# Patient Record
Sex: Male | Born: 1935
Health system: Southern US, Community
[De-identification: ages and names within clinical notes are randomized; demographics above are authoritative.]

## PROBLEM LIST (undated history)

## (undated) DIAGNOSIS — E538 Deficiency of other specified B group vitamins: Secondary | ICD-10-CM

## (undated) DIAGNOSIS — E785 Hyperlipidemia, unspecified: Secondary | ICD-10-CM

## (undated) DIAGNOSIS — R634 Abnormal weight loss: Secondary | ICD-10-CM

## (undated) DIAGNOSIS — M810 Age-related osteoporosis without current pathological fracture: Secondary | ICD-10-CM

## (undated) DIAGNOSIS — I10 Essential (primary) hypertension: Secondary | ICD-10-CM

## (undated) DIAGNOSIS — R0609 Other forms of dyspnea: Secondary | ICD-10-CM

## (undated) DIAGNOSIS — I480 Paroxysmal atrial fibrillation: Secondary | ICD-10-CM

## (undated) HISTORY — DX: Essential (primary) hypertension: I10

## (undated) HISTORY — DX: Abnormal weight loss: R63.4

## (undated) HISTORY — DX: Hyperlipidemia, unspecified: E78.5

## (undated) HISTORY — DX: Age-related osteoporosis without current pathological fracture: M81.0

## (undated) HISTORY — DX: Other forms of dyspnea: R06.09

## (undated) HISTORY — DX: Paroxysmal atrial fibrillation: I48.0

## (undated) HISTORY — PX: COLONOSCOPY: SHX174

## (undated) HISTORY — PX: NO PAST SURGERIES: SHX2092

## (undated) HISTORY — DX: Deficiency of other specified B group vitamins: E53.8

---

## 2009-08-24 ENCOUNTER — Ambulatory Visit: Payer: Self-pay | Admitting: Cardiovascular Disease

## 2009-09-12 ENCOUNTER — Ambulatory Visit: Payer: Self-pay | Admitting: Cardiovascular Disease

## 2009-10-19 ENCOUNTER — Ambulatory Visit: Payer: Self-pay | Admitting: Cardiovascular Disease

## 2010-02-03 ENCOUNTER — Ambulatory Visit: Payer: Self-pay | Admitting: Cardiovascular Disease

## 2010-05-30 ENCOUNTER — Ambulatory Visit: Payer: Self-pay | Admitting: Cardiovascular Disease

## 2010-09-12 NOTE — Procedures (Signed)
Summary: SUMMARY REPORT  SUMMARY REPORT   Imported By: Mirna Mires 09/14/2009 11:56:09  _____________________________________________________________________  External Attachment:    Type:   Image     Comment:   External Document

## 2010-09-12 NOTE — Procedures (Signed)
Summary: summary report  summary report   Imported By: Mirna Mires 09/14/2009 15:05:00  _____________________________________________________________________  External Attachment:    Type:   Image     Comment:   External Document

## 2010-11-30 ENCOUNTER — Encounter: Payer: Self-pay | Admitting: Cardiovascular Disease

## 2010-12-04 ENCOUNTER — Ambulatory Visit (INDEPENDENT_AMBULATORY_CARE_PROVIDER_SITE_OTHER): Payer: Medicare Other | Admitting: Cardiovascular Disease

## 2010-12-04 ENCOUNTER — Encounter: Payer: Self-pay | Admitting: Cardiovascular Disease

## 2010-12-04 DIAGNOSIS — I480 Paroxysmal atrial fibrillation: Secondary | ICD-10-CM | POA: Insufficient documentation

## 2010-12-04 DIAGNOSIS — I119 Hypertensive heart disease without heart failure: Secondary | ICD-10-CM | POA: Insufficient documentation

## 2010-12-04 DIAGNOSIS — E785 Hyperlipidemia, unspecified: Secondary | ICD-10-CM | POA: Insufficient documentation

## 2010-12-04 DIAGNOSIS — I4891 Unspecified atrial fibrillation: Secondary | ICD-10-CM

## 2010-12-04 DIAGNOSIS — I10 Essential (primary) hypertension: Secondary | ICD-10-CM

## 2010-12-04 NOTE — Assessment & Plan Note (Addendum)
He is normal sinus rhythm. His A-fib burden seems to be very low.  Continue long term anticoagulation with Pradaxa.

## 2010-12-04 NOTE — Assessment & Plan Note (Signed)
BP is reasonably controlled.  

## 2010-12-04 NOTE — Assessment & Plan Note (Signed)
Continue treatment with Simvastatin and fish oil. Goal LDL <100

## 2010-12-04 NOTE — Patient Instructions (Signed)
Your physician recommends that you schedule a follow-up appointment in: 6 months  

## 2010-12-04 NOTE — Progress Notes (Signed)
HPI  Jorge Lawrence is an 77 y/o male who is here for a routine 6 months follow up visit. He is doing very well. He denies any chest pain, dyspnea or palpitations. He is usually aware when his heart is irregular but that overall is very rare. He is tolerating Pradexa without any reported side effects.   Allergies  Allergen Reactions  . Aspirin      Current Outpatient Prescriptions on File Prior to Visit  Medication Sig Dispense Refill  . dabigatran (PRADAXA) 150 MG CAPS Take 150 mg by mouth every 12 (twelve) hours.        . fish oil-omega-3 fatty acids 1000 MG capsule Take 2 g by mouth daily.        Marland Kitchen losartan-hydrochlorothiazide (HYZAAR) 100-12.5 MG per tablet Take 1 tablet by mouth daily.        . simvastatin (ZOCOR) 20 MG tablet Take 20 mg by mouth at bedtime.           Past Medical History  Diagnosis Date  . HTN (hypertension)   . HLD (hyperlipidemia)   . Paroxysmal atrial fibrillation      No past surgical history on file.   No family history on file.   History   Social History  . Marital Status: Married    Spouse Name: N/A    Number of Children: 1  . Years of Education: N/A   Occupational History  . retired    Social History Main Topics  . Smoking status: Never Smoker   . Smokeless tobacco: Not on file  . Alcohol Use: No  . Drug Use: No  . Sexually Active: Not on file   Other Topics Concern  . Not on file   Social History Narrative  . No narrative on file     ROS Constitutional: Negative for fever, chills, diaphoresis, activity change, appetite change and fatigue.  HENT: Negative for hearing loss, nosebleeds, congestion, sore throat, facial swelling, drooling, trouble swallowing, neck pain, voice change, sinus pressure and tinnitus.  Eyes: Negative for photophobia, pain, discharge and visual disturbance.  Respiratory: Negative for apnea, cough, chest tightness, shortness of breath and wheezing.  Cardiovascular: Negative for chest pain, palpitations and  leg swelling.  Gastrointestinal: Negative for nausea, vomiting, abdominal pain, diarrhea, constipation, blood in stool and abdominal distention.  Genitourinary: Negative for dysuria, urgency, frequency, hematuria and decreased urine volume.  Musculoskeletal: Negative for myalgias, back pain, joint swelling, arthralgias and gait problem.  Skin: Negative for color change, pallor, rash and wound.  Neurological: Negative for dizziness, tremors, seizures, syncope, speech difficulty, weakness, light-headedness, numbness and headaches.  Psychiatric/Behavioral: Negative for suicidal ideas, hallucinations, behavioral problems and agitation. The patient is not nervous/anxious.     PHYSICAL EXAM   BP 142/80  Pulse 62  Ht 5\' 8"  (1.727 m)  Wt 180 lb (81.647 kg)  BMI 27.37 kg/m2  SpO2 94% Constitutional: He is oriented to person, place, and time. He appears well-developed and well-nourished. No distress.  HENT: No nasal discharge.  Head: Normocephalic and atraumatic.  Eyes: Pupils are equal, round, and reactive to light. Right eye exhibits no discharge. Left eye exhibits no discharge.  Neck: Normal range of motion. Neck supple. No JVD present. No thyromegaly present.  Cardiovascular: Normal rate, regular rhythm, normal heart sounds and intact distal pulses. Exam reveals no gallop and no friction rub.  No murmur heard.  Pulmonary/Chest: Effort normal and breath sounds normal. No stridor. No respiratory distress. He has no wheezes. He has no  rales. He exhibits no tenderness.  Abdominal: Soft. Bowel sounds are normal. He exhibits no distension. There is no tenderness. There is no rebound and no guarding.  Musculoskeletal: Normal range of motion. He exhibits no edema and no tenderness.  Neurological: He is alert and oriented to person, place, and time. Coordination normal.  Skin: Skin is warm and dry. No rash noted. He is not diaphoretic. No erythema. No pallor.  Psychiatric: He has a normal mood and  affect. His behavior is normal. Judgment and thought content normal.     ASSESSMENT AND PLAN

## 2010-12-26 NOTE — Assessment & Plan Note (Signed)
[facility]                        [place] CARDIOLOGY OFFICE NOTE   Jorge Lawrence, Jorge Lawrence                          MRN:          [id number]  DATE:08/24/2009                            DOB:          1927/05/05    CHIEF COMPLAINT:  Arrhythmia.   HISTORY OF PRESENT ILLNESS:  Jorge Lawrence is an 77 year old white male  with past medical history significant for hypertension who is presenting  for evaluation of possible atrial flutter.  The patient states that he  has a relatively healthy life only with a diagnosis of hypertension and  hyperlipidemia both of which are well controlled.  He remains relatively  active in that he walks 45 minutes several times a week without any  difficulty, whatsoever.  Specifically, he denies any chest discomfort,  palpitations, shortness of breath, dizziness, or syncope.  He was seen  by his primary care physician, Dr. Lorin Picket who felt that an EKG at that  time was consistent with atrial flutter with a controlled ventricular  response.  At that time, he was initiated on Pradaxa therapy.  Since  then, the patient states he continues to feel completely normal and at  baseline with no complaints.   PAST MEDICAL HISTORY:  As above in HPI.   SOCIAL HISTORY:  No tobacco, no alcohol.   FAMILY HISTORY:  Negative for premature coronary artery disease.   ALLERGIES:  The patient states that ASPIRIN caused a rash between his  fingers.   MEDICATIONS:  1. Simvastatin 20 mg p.o. nightly.  2. Benicar/HCT 20/12.5 daily.  3. Pradaxa150 mg b.i.d.  4. Fish oil.   REVIEW OF SYSTEMS:  As in the HPI.  All other systems are reviewed and  are negative.   PHYSICAL EXAMINATION:  VITAL SIGNS:  Blood pressure is 134/77, pulse is  85, saturating 97% on room air, and he weighs 176 pounds.  GENERAL:  No acute distress.  HEENT:  Normocephalic and atraumatic.  NECK:  Supple.  There is no JVD.  There are no carotid bruits.  HEART:  Regular rate and  rhythm without murmur, rub, or gallop.  LUNGS:  Clear bilaterally.  ABDOMEN:  Soft, nontender, and nondistended.  EXTREMITIES:  Without edema.  SKIN:  Warm and dry.  PSYCH:  The patient is appropriate with normal levels of insight.  NEURO:  Nonfocal.  MUSCULOSKELETAL:  The patient has 5/5 bilateral upper and lower  extremity strength.  VASCULAR:  2+ bilateral carotid, radial, and posterior tibial pulses.   LABORATORY DATA:  Reviewed by me from August 04, 2009, include a  sodium 141, potassium 4.2, chloride 101, BUN 15, and creatinine 1.1.  LFTs are also within normal limits except for mildly elevated protein at  8.3, his albumin is 4.5.  CBC:  White count 6.7, hemoglobin 16.3,  hematocrit 49, and platelet count 253.  PSA 3.3.  EKG taken today in  clinic independently reviewed by myself demonstrates normal sinus rhythm  with premature atrial contractions.  Review of EKGs from Dr. Roby Lofts  office dated August 04, 2009.  There is likely an atrial arrhythmia  that is probably  atrial flutter.  The QRS is regular and narrow at a  rate of approximately 60 beats per minute.  The flutter waves appear  close to typical, however.  There is some irregularity in their contour  raising some suspicion for artifact.   ASSESSMENT:  An 77 year old white male presenting with an incidental  discovery of what is possibly atrial flutter.  The patient is completely  asymptomatic from any arrhythmias at this time.  He is also not having  any symptoms consistent with angina or heart failure.   PLAN:  We would like to check a transthoracic echocardiogram to evaluate  the patient's heart structure and function.  I will also place the  patient on a 48-hour Holter monitor to assess for the presence of any  abnormal arrhythmias.  He can continue on the Pradaxa therapy for now.     Brayton El, MD  Electronically Signed    SGA/MedQ  DD: 08/24/2009  DT: 08/25/2009  Job #: [id number]

## 2010-12-26 NOTE — Assessment & Plan Note (Signed)
[facility]                        [place] CARDIOLOGY OFFICE NOTE   RHYDIAN, BALDI                          MRN:          [id number]  DATE:02/03/2010                            DOB:          1926-11-17    PROBLEM LIST:  1. Hypertension.  2. Hyperlipidemia.  3. Probable paroxysmal atrial flutter.   INTERVAL HISTORY:  The patient seems to do well.  He walks almost on a  daily basis without any significant difficulty.  He denies any chest  discomfort or shortness of breath.  He also has not had any issues with  bleeding while on the Pradaxa.   PHYSICAL EXAMINATION:  VITAL SIGNS:  On physical exam today, his blood  pressure is 122/65, pulse is 64, he is saturating 95% on room air, and  he weighs 178 pounds which is stable from March of this year.  GENERAL:  No acute distress.  HEENT:  Normocephalic, atraumatic.  NECK:  Supple.  No JVD.  No carotid bruits.  HEART:  Regular rate and rhythm without murmur, rub, or gallop.  LUNGS:  Clear bilaterally.  ABDOMEN:  Soft, nontender, nondistended.  EXTREMITIES:  Without edema.  SKIN:  Warm and dry.   EKG taken today in clinic independently interpreted by myself indicates  normal sinus rhythm with premature atrial contractions.   ASSESSMENT AND PLAN:  The patient is doing well.  He is about to change  his blood pressure medication to Benicar HCTZ to lisinopril HCTZ for  cost reasons.  He is being followed by Dr. Lorin Picket for hypertension and  hyperlipidemia.  He should continue on the Pradaxa 150 b.i.d. and be  observant for any potential bleeding.     Brayton El, MD  Electronically Signed    SGA/MedQ  DD: 02/03/2010  DT: 02/04/2010  Job #: [id number]

## 2010-12-26 NOTE — Assessment & Plan Note (Signed)
[facility]                         CARDIOLOGY OFFICE NOTE   Jorge Lawrence, Jorge Lawrence                          MRN:          [id number]  DATE:05/30/2010                            DOB:          1927-04-23    Jorge Lawrence is an 77 year old gentleman who is here today for a followup  visit.  He has the following problem list:  1. Probable paroxysmal atrial fibrillation.  2. Hypertension.  3. Hyperlipidemia.   INTERVAL HISTORY:  The patient has been doing very well.  There has been  no chest pain, dyspnea, dizziness, or syncope.  He does get episodes of  fluttering sensation in his chest every now and then.  He denies any  episodes of tachycardia.  He has been taking Pradaxa without any  reported side effects.   MEDICATIONS:  1. Simvastatin 20 mg at bedtime.  2. Pradaxa 150 mg twice daily.  3. Fish oil 1000 mg twice daily.  4. Losartan/hydrochlorothiazide 100/12.5 mg once daily.   ALLERGIES:  ASPIRIN which cause rash.   PHYSICAL EXAMINATION:  VITAL SIGNS:  Weight is 178 pounds, blood  pressure is 140/76, pulse is 69, oxygen saturation is 96% on room air.  NECK:  No JVD or carotid bruits.  LUNGS:  Clear to auscultation.  HEART:  Regular rate and rhythm with no gallops or murmurs.  ABDOMEN:  Benign, nontender, nondistended.  EXTREMITIES:  With no clubbing, cyanosis, or edema.   IMPRESSION:  1. Probable paroxysmal atrial fibrillation:  I reviewed all his      previous EKGs.  Some of the EKGs were read as atrial flutter.  In      my opinion, it was a sinus rhythm with motion artifact.  However,      his Holter monitor showed brief runs of irregular narrow complex      tachycardia suggestive of atrial fibrillation.  Most of the time,      though he was in sinus rhythm with frequent PACs.  He has not been      having any episodes of tachycardia.  Thus at this time, I will      continue long-term anticoagulation with Pradaxa.  2. Hypertension:  His  blood pressure is reasonably controlled with      losartan/hydrochlorothiazide.  3. Hyperlipidemia.  We will continue with simvastatin 20 mg daily as      well as fish oil.  He will follow up in 6 months from now or      earlier if needed.     Jorge Bears, MD  Electronically Signed    MA/MedQ  DD: 05/30/2010  DT: 05/31/2010  Job #: [id number]

## 2010-12-26 NOTE — Assessment & Plan Note (Signed)
[facility]                        [place] CARDIOLOGY OFFICE NOTE   MORGON, PAMER                          MRN:          [id number]  DATE:10/19/2009                            DOB:          August 24, 1926    PROBLEM LIST:  1. Hypertension.  2. Hyperlipidemia.  3. Possible history of atrial flutter.   INTERVAL HISTORY:  The patient states since his last visit, he has been  doing fairly well.  He has been taking his Pradaxa twice a day and has  not had any issues with bleeding.  He also denies any chest discomfort,  shortness of breath, dyspnea on exertion.  He just states that in the  evenings when he sits up, he oftentimes feels a bit lightheaded.  This  has been present since he has been taking the Pradaxa.   PHYSICAL EXAMINATION:  VITAL SIGNS:  Blood pressure 128/74, pulse is 65,  satting 96% on room air, and he weighs 177 pounds.  GENERAL:  No acute distress.  HEENT:  Normocephalic, atraumatic.  NECK:  Supple.  HEART:  Regular rate and rhythm without murmur, rub, or gallop.  LUNGS:  Clear bilaterally.  ABDOMEN:  Soft, nontender, nondistended.  EXTREMITIES:  Without edema.  SKIN:  Warm and dry.   EKG taken today in clinic and apparently interpreted myself demonstrates  normal sinus rhythm.  Review of the patient's echocardiogram, ejection  fraction was 50%.  There is no significant valvular regurgitation and  the left atrium was measured at 4.4 cm.  Review of the patient's 48-hour  Holter monitor shows predominant normal sinus rhythm with frequent PACs,  short run of atrial fibrillation cannot be excluded.   ASSESSMENT/PLAN:  This is an 77 year old white male with hypertension,  likely has asymptomatic atrial fibrillation/flutter that is rate  controlled.  At this point, I would recommend him continuing on Pradaxa  therapy.  The patient will see Dr. Lorin Picket in the near future in order to  change his Benicar HCT to something that is on his  insurance plan.  I  have asked the patient to increase his fluid intake to help avoid what  sounds like postural hypotension that he experiences in the evenings.  We will see the patient back in 3 months' time.    Brayton El, MD  Electronically Signed   SGA/MedQ  DD: 10/19/2009  DT: 10/20/2009  Job #: [id number]   cc:   Lucila Maine, MD

## 2010-12-27 ENCOUNTER — Other Ambulatory Visit: Payer: Self-pay | Admitting: Cardiovascular Disease

## 2011-03-01 ENCOUNTER — Encounter: Payer: Self-pay | Admitting: Cardiovascular Disease

## 2011-09-03 DIAGNOSIS — I10 Essential (primary) hypertension: Secondary | ICD-10-CM | POA: Diagnosis not present

## 2011-09-03 DIAGNOSIS — Z79899 Other long term (current) drug therapy: Secondary | ICD-10-CM | POA: Diagnosis not present

## 2011-09-03 DIAGNOSIS — I4892 Unspecified atrial flutter: Secondary | ICD-10-CM | POA: Diagnosis not present

## 2011-09-03 DIAGNOSIS — E782 Mixed hyperlipidemia: Secondary | ICD-10-CM | POA: Diagnosis not present

## 2012-05-29 DIAGNOSIS — Z79899 Other long term (current) drug therapy: Secondary | ICD-10-CM | POA: Diagnosis not present

## 2012-05-29 DIAGNOSIS — I4892 Unspecified atrial flutter: Secondary | ICD-10-CM | POA: Diagnosis not present

## 2012-05-29 DIAGNOSIS — Z23 Encounter for immunization: Secondary | ICD-10-CM | POA: Diagnosis not present

## 2012-05-29 DIAGNOSIS — I10 Essential (primary) hypertension: Secondary | ICD-10-CM | POA: Diagnosis not present

## 2012-05-29 DIAGNOSIS — E782 Mixed hyperlipidemia: Secondary | ICD-10-CM | POA: Diagnosis not present

## 2012-05-29 DIAGNOSIS — Z1211 Encounter for screening for malignant neoplasm of colon: Secondary | ICD-10-CM | POA: Diagnosis not present

## 2012-11-27 DIAGNOSIS — I4892 Unspecified atrial flutter: Secondary | ICD-10-CM | POA: Diagnosis not present

## 2012-11-27 DIAGNOSIS — Z79899 Other long term (current) drug therapy: Secondary | ICD-10-CM | POA: Diagnosis not present

## 2012-11-27 DIAGNOSIS — E782 Mixed hyperlipidemia: Secondary | ICD-10-CM | POA: Diagnosis not present

## 2012-11-27 DIAGNOSIS — I10 Essential (primary) hypertension: Secondary | ICD-10-CM | POA: Diagnosis not present

## 2013-06-15 DIAGNOSIS — Z006 Encounter for examination for normal comparison and control in clinical research program: Secondary | ICD-10-CM | POA: Diagnosis not present

## 2013-06-15 DIAGNOSIS — E782 Mixed hyperlipidemia: Secondary | ICD-10-CM | POA: Diagnosis not present

## 2013-06-15 DIAGNOSIS — Z125 Encounter for screening for malignant neoplasm of prostate: Secondary | ICD-10-CM | POA: Diagnosis not present

## 2013-06-15 DIAGNOSIS — Z79899 Other long term (current) drug therapy: Secondary | ICD-10-CM | POA: Diagnosis not present

## 2013-06-15 DIAGNOSIS — I4892 Unspecified atrial flutter: Secondary | ICD-10-CM | POA: Diagnosis not present

## 2013-06-15 DIAGNOSIS — Z23 Encounter for immunization: Secondary | ICD-10-CM | POA: Diagnosis not present

## 2013-06-15 DIAGNOSIS — I10 Essential (primary) hypertension: Secondary | ICD-10-CM | POA: Diagnosis not present

## 2013-06-15 DIAGNOSIS — L570 Actinic keratosis: Secondary | ICD-10-CM | POA: Diagnosis not present

## 2013-11-20 DIAGNOSIS — I4892 Unspecified atrial flutter: Secondary | ICD-10-CM | POA: Diagnosis not present

## 2013-11-20 DIAGNOSIS — Z0000 Encounter for general adult medical examination without abnormal findings: Secondary | ICD-10-CM | POA: Diagnosis not present

## 2013-11-20 DIAGNOSIS — E782 Mixed hyperlipidemia: Secondary | ICD-10-CM | POA: Diagnosis not present

## 2013-11-20 DIAGNOSIS — Z79899 Other long term (current) drug therapy: Secondary | ICD-10-CM | POA: Diagnosis not present

## 2013-11-20 DIAGNOSIS — I10 Essential (primary) hypertension: Secondary | ICD-10-CM | POA: Diagnosis not present

## 2014-05-24 DIAGNOSIS — I10 Essential (primary) hypertension: Secondary | ICD-10-CM | POA: Diagnosis not present

## 2014-05-24 DIAGNOSIS — Z79899 Other long term (current) drug therapy: Secondary | ICD-10-CM | POA: Diagnosis not present

## 2014-05-24 DIAGNOSIS — I4891 Unspecified atrial fibrillation: Secondary | ICD-10-CM | POA: Diagnosis not present

## 2014-05-24 DIAGNOSIS — E782 Mixed hyperlipidemia: Secondary | ICD-10-CM | POA: Diagnosis not present

## 2014-05-24 DIAGNOSIS — Z23 Encounter for immunization: Secondary | ICD-10-CM | POA: Diagnosis not present

## 2014-11-22 DIAGNOSIS — Z125 Encounter for screening for malignant neoplasm of prostate: Secondary | ICD-10-CM | POA: Diagnosis not present

## 2014-11-22 DIAGNOSIS — M8580 Other specified disorders of bone density and structure, unspecified site: Secondary | ICD-10-CM | POA: Diagnosis not present

## 2014-11-22 DIAGNOSIS — Z0000 Encounter for general adult medical examination without abnormal findings: Secondary | ICD-10-CM | POA: Diagnosis not present

## 2014-11-22 DIAGNOSIS — E782 Mixed hyperlipidemia: Secondary | ICD-10-CM | POA: Diagnosis not present

## 2014-11-22 DIAGNOSIS — Z79899 Other long term (current) drug therapy: Secondary | ICD-10-CM | POA: Diagnosis not present

## 2014-11-22 DIAGNOSIS — I480 Paroxysmal atrial fibrillation: Secondary | ICD-10-CM | POA: Diagnosis not present

## 2014-11-22 DIAGNOSIS — I10 Essential (primary) hypertension: Secondary | ICD-10-CM | POA: Diagnosis not present

## 2015-05-25 DIAGNOSIS — Z23 Encounter for immunization: Secondary | ICD-10-CM | POA: Diagnosis not present

## 2015-05-25 DIAGNOSIS — Z79899 Other long term (current) drug therapy: Secondary | ICD-10-CM | POA: Diagnosis not present

## 2015-05-25 DIAGNOSIS — I480 Paroxysmal atrial fibrillation: Secondary | ICD-10-CM | POA: Diagnosis not present

## 2015-05-25 DIAGNOSIS — E782 Mixed hyperlipidemia: Secondary | ICD-10-CM | POA: Diagnosis not present

## 2015-11-23 DIAGNOSIS — E782 Mixed hyperlipidemia: Secondary | ICD-10-CM | POA: Diagnosis not present

## 2015-11-23 DIAGNOSIS — I480 Paroxysmal atrial fibrillation: Secondary | ICD-10-CM | POA: Diagnosis not present

## 2015-11-23 DIAGNOSIS — I10 Essential (primary) hypertension: Secondary | ICD-10-CM | POA: Diagnosis not present

## 2015-11-23 DIAGNOSIS — Z0000 Encounter for general adult medical examination without abnormal findings: Secondary | ICD-10-CM | POA: Diagnosis not present

## 2015-11-23 DIAGNOSIS — Z79899 Other long term (current) drug therapy: Secondary | ICD-10-CM | POA: Diagnosis not present

## 2016-05-25 DIAGNOSIS — I10 Essential (primary) hypertension: Secondary | ICD-10-CM | POA: Diagnosis not present

## 2016-05-25 DIAGNOSIS — Z125 Encounter for screening for malignant neoplasm of prostate: Secondary | ICD-10-CM | POA: Diagnosis not present

## 2016-05-25 DIAGNOSIS — E782 Mixed hyperlipidemia: Secondary | ICD-10-CM | POA: Diagnosis not present

## 2016-05-25 DIAGNOSIS — Z79899 Other long term (current) drug therapy: Secondary | ICD-10-CM | POA: Diagnosis not present

## 2016-05-25 DIAGNOSIS — I480 Paroxysmal atrial fibrillation: Secondary | ICD-10-CM | POA: Diagnosis not present

## 2016-05-25 DIAGNOSIS — Z23 Encounter for immunization: Secondary | ICD-10-CM | POA: Diagnosis not present

## 2016-11-23 DIAGNOSIS — I480 Paroxysmal atrial fibrillation: Secondary | ICD-10-CM | POA: Diagnosis not present

## 2016-11-23 DIAGNOSIS — E782 Mixed hyperlipidemia: Secondary | ICD-10-CM | POA: Diagnosis not present

## 2016-11-23 DIAGNOSIS — Z79899 Other long term (current) drug therapy: Secondary | ICD-10-CM | POA: Diagnosis not present

## 2016-11-23 DIAGNOSIS — I10 Essential (primary) hypertension: Secondary | ICD-10-CM | POA: Diagnosis not present

## 2016-11-23 DIAGNOSIS — M8580 Other specified disorders of bone density and structure, unspecified site: Secondary | ICD-10-CM | POA: Diagnosis not present

## 2016-11-23 DIAGNOSIS — Z0000 Encounter for general adult medical examination without abnormal findings: Secondary | ICD-10-CM | POA: Diagnosis not present

## 2016-11-27 DIAGNOSIS — Z1211 Encounter for screening for malignant neoplasm of colon: Secondary | ICD-10-CM | POA: Diagnosis not present

## 2017-05-27 DIAGNOSIS — I10 Essential (primary) hypertension: Secondary | ICD-10-CM | POA: Diagnosis not present

## 2017-05-27 DIAGNOSIS — I480 Paroxysmal atrial fibrillation: Secondary | ICD-10-CM | POA: Diagnosis not present

## 2017-05-27 DIAGNOSIS — Z79899 Other long term (current) drug therapy: Secondary | ICD-10-CM | POA: Diagnosis not present

## 2017-05-27 DIAGNOSIS — Z23 Encounter for immunization: Secondary | ICD-10-CM | POA: Diagnosis not present

## 2017-05-27 DIAGNOSIS — Z1331 Encounter for screening for depression: Secondary | ICD-10-CM | POA: Diagnosis not present

## 2017-05-27 DIAGNOSIS — Z125 Encounter for screening for malignant neoplasm of prostate: Secondary | ICD-10-CM | POA: Diagnosis not present

## 2017-05-27 DIAGNOSIS — N402 Nodular prostate without lower urinary tract symptoms: Secondary | ICD-10-CM | POA: Diagnosis not present

## 2017-05-27 DIAGNOSIS — E782 Mixed hyperlipidemia: Secondary | ICD-10-CM | POA: Diagnosis not present

## 2017-11-26 DIAGNOSIS — Z125 Encounter for screening for malignant neoplasm of prostate: Secondary | ICD-10-CM | POA: Diagnosis not present

## 2017-11-26 DIAGNOSIS — N402 Nodular prostate without lower urinary tract symptoms: Secondary | ICD-10-CM | POA: Diagnosis not present

## 2017-11-26 DIAGNOSIS — Z79899 Other long term (current) drug therapy: Secondary | ICD-10-CM | POA: Diagnosis not present

## 2017-11-26 DIAGNOSIS — Z0000 Encounter for general adult medical examination without abnormal findings: Secondary | ICD-10-CM | POA: Diagnosis not present

## 2017-11-26 DIAGNOSIS — I10 Essential (primary) hypertension: Secondary | ICD-10-CM | POA: Diagnosis not present

## 2017-11-26 DIAGNOSIS — I4892 Unspecified atrial flutter: Secondary | ICD-10-CM | POA: Diagnosis not present

## 2017-11-26 DIAGNOSIS — E782 Mixed hyperlipidemia: Secondary | ICD-10-CM | POA: Diagnosis not present

## 2017-12-16 DIAGNOSIS — I4892 Unspecified atrial flutter: Secondary | ICD-10-CM | POA: Diagnosis not present

## 2018-02-04 DIAGNOSIS — Z961 Presence of intraocular lens: Secondary | ICD-10-CM | POA: Diagnosis not present

## 2018-02-04 DIAGNOSIS — I10 Essential (primary) hypertension: Secondary | ICD-10-CM | POA: Diagnosis not present

## 2018-02-04 DIAGNOSIS — H26491 Other secondary cataract, right eye: Secondary | ICD-10-CM | POA: Diagnosis not present

## 2018-02-04 DIAGNOSIS — H26492 Other secondary cataract, left eye: Secondary | ICD-10-CM | POA: Diagnosis not present

## 2018-03-14 DIAGNOSIS — Z9842 Cataract extraction status, left eye: Secondary | ICD-10-CM | POA: Diagnosis not present

## 2018-03-14 DIAGNOSIS — H26492 Other secondary cataract, left eye: Secondary | ICD-10-CM | POA: Diagnosis not present

## 2018-03-14 DIAGNOSIS — Z961 Presence of intraocular lens: Secondary | ICD-10-CM | POA: Diagnosis not present

## 2018-04-01 DIAGNOSIS — H26491 Other secondary cataract, right eye: Secondary | ICD-10-CM | POA: Diagnosis not present

## 2018-04-01 DIAGNOSIS — Z961 Presence of intraocular lens: Secondary | ICD-10-CM | POA: Diagnosis not present

## 2018-04-01 DIAGNOSIS — Z9841 Cataract extraction status, right eye: Secondary | ICD-10-CM | POA: Diagnosis not present

## 2018-04-01 DIAGNOSIS — Z09 Encounter for follow-up examination after completed treatment for conditions other than malignant neoplasm: Secondary | ICD-10-CM | POA: Diagnosis not present

## 2018-04-01 DIAGNOSIS — Z9842 Cataract extraction status, left eye: Secondary | ICD-10-CM | POA: Diagnosis not present

## 2018-05-23 DIAGNOSIS — Z23 Encounter for immunization: Secondary | ICD-10-CM | POA: Diagnosis not present

## 2018-05-23 DIAGNOSIS — I10 Essential (primary) hypertension: Secondary | ICD-10-CM | POA: Diagnosis not present

## 2018-05-23 DIAGNOSIS — Z1331 Encounter for screening for depression: Secondary | ICD-10-CM | POA: Diagnosis not present

## 2018-05-23 DIAGNOSIS — I480 Paroxysmal atrial fibrillation: Secondary | ICD-10-CM | POA: Diagnosis not present

## 2018-09-29 DIAGNOSIS — Z9842 Cataract extraction status, left eye: Secondary | ICD-10-CM | POA: Diagnosis not present

## 2018-09-29 DIAGNOSIS — Z961 Presence of intraocular lens: Secondary | ICD-10-CM | POA: Diagnosis not present

## 2018-09-29 DIAGNOSIS — Z9841 Cataract extraction status, right eye: Secondary | ICD-10-CM | POA: Diagnosis not present

## 2018-12-29 DIAGNOSIS — Z0000 Encounter for general adult medical examination without abnormal findings: Secondary | ICD-10-CM | POA: Diagnosis not present

## 2018-12-29 DIAGNOSIS — I10 Essential (primary) hypertension: Secondary | ICD-10-CM | POA: Diagnosis not present

## 2018-12-29 DIAGNOSIS — E782 Mixed hyperlipidemia: Secondary | ICD-10-CM | POA: Diagnosis not present

## 2018-12-29 DIAGNOSIS — Z79899 Other long term (current) drug therapy: Secondary | ICD-10-CM | POA: Diagnosis not present

## 2018-12-29 DIAGNOSIS — I480 Paroxysmal atrial fibrillation: Secondary | ICD-10-CM | POA: Diagnosis not present

## 2019-03-30 DIAGNOSIS — H3530 Unspecified macular degeneration: Secondary | ICD-10-CM | POA: Diagnosis not present

## 2019-03-30 DIAGNOSIS — H52223 Regular astigmatism, bilateral: Secondary | ICD-10-CM | POA: Diagnosis not present

## 2019-03-30 DIAGNOSIS — H353132 Nonexudative age-related macular degeneration, bilateral, intermediate dry stage: Secondary | ICD-10-CM | POA: Diagnosis not present

## 2019-03-30 DIAGNOSIS — H5201 Hypermetropia, right eye: Secondary | ICD-10-CM | POA: Diagnosis not present

## 2019-03-30 DIAGNOSIS — H524 Presbyopia: Secondary | ICD-10-CM | POA: Diagnosis not present

## 2019-04-23 DIAGNOSIS — Z20828 Contact with and (suspected) exposure to other viral communicable diseases: Secondary | ICD-10-CM | POA: Diagnosis not present

## 2019-05-27 DIAGNOSIS — Z9181 History of falling: Secondary | ICD-10-CM | POA: Diagnosis not present

## 2019-05-27 DIAGNOSIS — I10 Essential (primary) hypertension: Secondary | ICD-10-CM | POA: Diagnosis not present

## 2019-05-27 DIAGNOSIS — Z23 Encounter for immunization: Secondary | ICD-10-CM | POA: Diagnosis not present

## 2019-05-27 DIAGNOSIS — I480 Paroxysmal atrial fibrillation: Secondary | ICD-10-CM | POA: Diagnosis not present

## 2019-05-27 DIAGNOSIS — Z1331 Encounter for screening for depression: Secondary | ICD-10-CM | POA: Diagnosis not present

## 2020-11-23 DIAGNOSIS — Z9181 History of falling: Secondary | ICD-10-CM | POA: Diagnosis not present

## 2020-11-23 DIAGNOSIS — Z1331 Encounter for screening for depression: Secondary | ICD-10-CM | POA: Diagnosis not present

## 2020-11-23 DIAGNOSIS — I480 Paroxysmal atrial fibrillation: Secondary | ICD-10-CM | POA: Diagnosis not present

## 2020-11-23 DIAGNOSIS — I10 Essential (primary) hypertension: Secondary | ICD-10-CM | POA: Diagnosis not present

## 2020-12-01 DIAGNOSIS — Z9842 Cataract extraction status, left eye: Secondary | ICD-10-CM | POA: Diagnosis not present

## 2020-12-01 DIAGNOSIS — H5203 Hypermetropia, bilateral: Secondary | ICD-10-CM | POA: Diagnosis not present

## 2020-12-01 DIAGNOSIS — H52223 Regular astigmatism, bilateral: Secondary | ICD-10-CM | POA: Diagnosis not present

## 2020-12-01 DIAGNOSIS — Z9841 Cataract extraction status, right eye: Secondary | ICD-10-CM | POA: Diagnosis not present

## 2020-12-01 DIAGNOSIS — H353132 Nonexudative age-related macular degeneration, bilateral, intermediate dry stage: Secondary | ICD-10-CM | POA: Diagnosis not present

## 2020-12-01 DIAGNOSIS — H35363 Drusen (degenerative) of macula, bilateral: Secondary | ICD-10-CM | POA: Diagnosis not present

## 2020-12-01 DIAGNOSIS — H524 Presbyopia: Secondary | ICD-10-CM | POA: Diagnosis not present

## 2020-12-01 DIAGNOSIS — Z961 Presence of intraocular lens: Secondary | ICD-10-CM | POA: Diagnosis not present

## 2020-12-01 DIAGNOSIS — I10 Essential (primary) hypertension: Secondary | ICD-10-CM | POA: Diagnosis not present

## 2020-12-27 DIAGNOSIS — H6121 Impacted cerumen, right ear: Secondary | ICD-10-CM | POA: Diagnosis not present

## 2020-12-27 DIAGNOSIS — Z6826 Body mass index (BMI) 26.0-26.9, adult: Secondary | ICD-10-CM | POA: Diagnosis not present

## 2020-12-27 DIAGNOSIS — H6690 Otitis media, unspecified, unspecified ear: Secondary | ICD-10-CM | POA: Diagnosis not present

## 2021-05-29 DIAGNOSIS — I480 Paroxysmal atrial fibrillation: Secondary | ICD-10-CM | POA: Diagnosis not present

## 2021-05-29 DIAGNOSIS — Z0000 Encounter for general adult medical examination without abnormal findings: Secondary | ICD-10-CM | POA: Diagnosis not present

## 2021-05-29 DIAGNOSIS — Z79899 Other long term (current) drug therapy: Secondary | ICD-10-CM | POA: Diagnosis not present

## 2021-05-29 DIAGNOSIS — M8589 Other specified disorders of bone density and structure, multiple sites: Secondary | ICD-10-CM | POA: Diagnosis not present

## 2021-05-29 DIAGNOSIS — E785 Hyperlipidemia, unspecified: Secondary | ICD-10-CM | POA: Diagnosis not present

## 2021-05-29 DIAGNOSIS — R634 Abnormal weight loss: Secondary | ICD-10-CM | POA: Diagnosis not present

## 2021-05-29 DIAGNOSIS — R0609 Other forms of dyspnea: Secondary | ICD-10-CM | POA: Diagnosis not present

## 2021-05-29 DIAGNOSIS — Z23 Encounter for immunization: Secondary | ICD-10-CM | POA: Diagnosis not present

## 2021-05-29 DIAGNOSIS — I10 Essential (primary) hypertension: Secondary | ICD-10-CM | POA: Diagnosis not present

## 2021-05-29 DIAGNOSIS — D62 Acute posthemorrhagic anemia: Secondary | ICD-10-CM | POA: Diagnosis not present

## 2021-05-30 DIAGNOSIS — D62 Acute posthemorrhagic anemia: Secondary | ICD-10-CM | POA: Diagnosis not present

## 2021-06-01 DIAGNOSIS — D62 Acute posthemorrhagic anemia: Secondary | ICD-10-CM | POA: Diagnosis not present

## 2021-06-01 DIAGNOSIS — Z79899 Other long term (current) drug therapy: Secondary | ICD-10-CM | POA: Diagnosis not present

## 2021-06-02 DIAGNOSIS — E538 Deficiency of other specified B group vitamins: Secondary | ICD-10-CM | POA: Diagnosis not present

## 2021-06-02 DIAGNOSIS — D62 Acute posthemorrhagic anemia: Secondary | ICD-10-CM | POA: Diagnosis not present

## 2021-06-03 DIAGNOSIS — D62 Acute posthemorrhagic anemia: Secondary | ICD-10-CM | POA: Diagnosis not present

## 2021-06-03 DIAGNOSIS — K922 Gastrointestinal hemorrhage, unspecified: Secondary | ICD-10-CM | POA: Diagnosis not present

## 2021-06-03 DIAGNOSIS — I482 Chronic atrial fibrillation, unspecified: Secondary | ICD-10-CM | POA: Diagnosis not present

## 2021-06-06 ENCOUNTER — Telehealth: Payer: Self-pay | Admitting: Gastroenterology

## 2021-06-06 ENCOUNTER — Encounter: Payer: Self-pay | Admitting: Gastroenterology

## 2021-06-06 DIAGNOSIS — H353132 Nonexudative age-related macular degeneration, bilateral, intermediate dry stage: Secondary | ICD-10-CM | POA: Diagnosis not present

## 2021-06-06 DIAGNOSIS — H524 Presbyopia: Secondary | ICD-10-CM | POA: Diagnosis not present

## 2021-06-06 DIAGNOSIS — Z9849 Cataract extraction status, unspecified eye: Secondary | ICD-10-CM | POA: Diagnosis not present

## 2021-06-06 DIAGNOSIS — H5203 Hypermetropia, bilateral: Secondary | ICD-10-CM | POA: Diagnosis not present

## 2021-06-06 DIAGNOSIS — Z961 Presence of intraocular lens: Secondary | ICD-10-CM | POA: Diagnosis not present

## 2021-06-06 DIAGNOSIS — D500 Iron deficiency anemia secondary to blood loss (chronic): Secondary | ICD-10-CM | POA: Diagnosis not present

## 2021-06-06 DIAGNOSIS — H52223 Regular astigmatism, bilateral: Secondary | ICD-10-CM | POA: Diagnosis not present

## 2021-06-06 NOTE — Telephone Encounter (Signed)
I have not met the patient previously reviewed any records. Happy for Dr. Lyndel Safe to be his primary GI. Thanks. GM

## 2021-06-06 NOTE — Telephone Encounter (Signed)
Please see note below and advise  

## 2021-06-06 NOTE — Telephone Encounter (Signed)
No problems Pl get any old records. Will do only if OK with Dr Rush Landmark  RG

## 2021-06-06 NOTE — Telephone Encounter (Signed)
Serita Grammes, patient's PCP, [phone], called requesting to have patient's appointment moved up and made with Dr. Lyndel Safe if possible.  (The daughter of patient is Franki Monte who is friends with Dr. Steve Rattler wife).  His hemoglobin levels keep dropping and they want to know the cause.  He has an appointment with Dr. Rush Landmark as a new patient on 11/10, but if Dr. Lyndel Safe can see him sooner, they would be most appreciative.  Please call PCP as soon as you can to let her know if Dr. Lyndel Safe can accommodate patient earlier.  Thank you.

## 2021-06-07 DIAGNOSIS — I480 Paroxysmal atrial fibrillation: Secondary | ICD-10-CM | POA: Diagnosis not present

## 2021-06-07 DIAGNOSIS — D62 Acute posthemorrhagic anemia: Secondary | ICD-10-CM | POA: Diagnosis not present

## 2021-06-07 DIAGNOSIS — Z6826 Body mass index (BMI) 26.0-26.9, adult: Secondary | ICD-10-CM | POA: Diagnosis not present

## 2021-06-07 DIAGNOSIS — K219 Gastro-esophageal reflux disease without esophagitis: Secondary | ICD-10-CM | POA: Diagnosis not present

## 2021-06-07 NOTE — Telephone Encounter (Signed)
Please advise on a date and time that is soon for this pt for an OV.

## 2021-06-08 NOTE — Telephone Encounter (Signed)
Pt was made aware by Dr Grafton Folk office of the appointment that was made to see Dr. Lyndel Safe 06/12/21

## 2021-06-08 NOTE — Telephone Encounter (Signed)
Patient has been scheduled for 06-12-21 with Dr. Lyndel Safe. Also obtained records faxing them to [facility].

## 2021-06-12 ENCOUNTER — Other Ambulatory Visit (INDEPENDENT_AMBULATORY_CARE_PROVIDER_SITE_OTHER): Payer: Medicare Other

## 2021-06-12 ENCOUNTER — Other Ambulatory Visit: Payer: Self-pay

## 2021-06-12 ENCOUNTER — Ambulatory Visit (INDEPENDENT_AMBULATORY_CARE_PROVIDER_SITE_OTHER): Payer: Medicare Other | Admitting: Gastroenterology

## 2021-06-12 ENCOUNTER — Encounter: Payer: Self-pay | Admitting: Gastroenterology

## 2021-06-12 VITALS — BP 140/60 | HR 64 | Ht 68.0 in | Wt 167.2 lb

## 2021-06-12 DIAGNOSIS — D500 Iron deficiency anemia secondary to blood loss (chronic): Secondary | ICD-10-CM

## 2021-06-12 DIAGNOSIS — R634 Abnormal weight loss: Secondary | ICD-10-CM

## 2021-06-12 DIAGNOSIS — R1084 Generalized abdominal pain: Secondary | ICD-10-CM | POA: Diagnosis not present

## 2021-06-12 NOTE — Progress Notes (Signed)
Chief Complaint: Symptomatic anemia  Referring Provider:  Serita Grammes, MD      ASSESSMENT AND PLAN;   #1. IDA with H+ stools  #2. Abdo pain/Wt loss  #3. A Fib (off eliquis since 10/26)  Plan: -CBC, CMP today -CT AP with PO/IV contrast. -EGD/colon with mirlax after cardio clearence (Dr Cleda Daub will make appt)   I discussed EGD/Colonoscopy- the indications, risks, alternatives and potential complications including, but not limited to, bleeding, infection, reaction to medication, damage to internal organs, cardiac and/or pulmonary problems, and perforation requiring surgery (1 to 2 in 1000). The possibility that significant findings could be missed was explained. All ? were answered. The patient gives consent to proceed. HPI:    Jorge Lawrence is a 87 y.o. male  With H/O A Fib on eliquis, HTN  With generalized weakness, better after transfusion Hb 14 (June 2022) to 6.5 s/p 2U to 9.0 with heme + brown stools. Low MCV Stopped eliquis 10/26 Started on omeprazole 20mg  po qd Patient does feel better Sent to GI clinic for further evaluation.  He does have occasional heartburn, occasional upper abdominal discomfort/pain after eating.  No nausea, vomiting, regurgitation, odynophagia or dysphagia.  No significant diarrhea or constipation.  No melena or hematochezia.  Has weight loss as described below.  Wt Readings from Last 3 Encounters:  06/12/21 167 lb 4 oz (75.9 kg)  12/04/10 180 lb (81.6 kg)  Review for the labs -Iron studies consistent with IDA with ferritin 4, iron 19%, saturation 6% -Hb 6.5, MCV 70, platelets 400 on 06/01/2021.  Normal B12 227, normal TSH, normal LFTs  Previous GI procedures -Had colonoscopy in 1999, repeat 2009 which showed diminutive tubular adenoma.  He had follow-up colonoscopy in 2012 which was unremarkable. Dr Lyda Rastetter.  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 No pertinent surgical history.  Family History  Problem Relation Age of Onset   Stomach cancer Father        died at age 18   Colon cancer Sister     Social History   Tobacco Use   Smoking status: Never   Smokeless tobacco: Never  Substance Use Topics   Alcohol use: No   Drug use: No    Current Outpatient Medications  Medication Sig Dispense Refill   losartan-hydrochlorothiazide (HYZAAR) 100-12.5 MG per tablet Take 1 tablet by mouth daily.       omeprazole (PRILOSEC) 20 MG capsule Take 20 mg by mouth at bedtime.     No current facility-administered medications for this visit.    Allergies  Allergen Reactions   Aspirin     Review of Systems:  Constitutional: Denies fever, chills, diaphoresis, appetite change and had fatigue.  HEENT: Denies photophobia, eye pain, redness, hearing loss, ear pain, congestion, sore throat, rhinorrhea, sneezing, mouth sores, neck pain, neck stiffness and tinnitus.   Respiratory: Denies SOB, DOE, cough, chest tightness,  and wheezing.   Cardiovascular: Denies chest pain, palpitations and leg swelling.  Genitourinary: Denies dysuria, urgency, frequency, hematuria, flank pain and difficulty urinating.  Musculoskeletal: Denies myalgias, back pain, joint swelling, arthralgias and gait problem.  Skin: No rash.  Neurological: Denies dizziness, seizures, syncope, weakness, light-headedness, numbness and headaches.  Hematological: Denies adenopathy. Easy bruising, personal or family bleeding history  Psychiatric/Behavioral: No anxiety or depression     Physical Exam:    BP  140/60   Pulse 64   Ht 5\' 8"  (1.727 m)   Wt 167 lb 4 oz (75.9 kg)   SpO2 98%   BMI 25.43 kg/m  Wt Readings from Last 3 Encounters:  06/12/21 167 lb 4 oz (75.9 kg)  12/04/10 180 lb (81.6 kg)   Constitutional:  Well-developed, in no acute distress. Psychiatric: Normal mood and affect.  Behavior is normal. HEENT: Pupils normal.  Conjunctivae are normal. No scleral icterus. Cardiovascular: Normal rate, regular rhythm. No edema Pulmonary/chest: Effort normal and breath sounds normal. No wheezing, rales or rhonchi. Abdominal: Soft, nondistended. Nontender. Bowel sounds active throughout. There are no masses palpable. No hepatomegaly. Rectal: Deferred Neurological: Alert and oriented to person place and time. Skin: Skin is warm and dry. No rashes noted.    Carmell Austria, MD 06/12/2021, 3:06 PM  Cc: Serita Grammes, MD

## 2021-06-12 NOTE — Patient Instructions (Signed)
If you are age 85 or older, your body mass index should be between 23-30. Your Body mass index is 25.43 kg/m. If this is out of the aforementioned range listed, please consider follow up with your Primary Care Provider.  If you are age 43 or younger, your body mass index should be between 19-25. Your Body mass index is 25.43 kg/m. If this is out of the aformentioned range listed, please consider follow up with your Primary Care Provider.   __________________________________________________________  The [place] [facility] providers would like to encourage you to use [facility] to communicate with providers for non-urgent requests or questions.  Due to long hold times on the telephone, sending your provider a message by [facility] may be a faster and more efficient way to get a response.  Please allow 48 business hours for a response.  Please remember that this is for non-urgent requests.   Please go to the lab on the 2nd floor [address] before you leave the office today.   Please go to radiology on the 1st floor to schedule the Cat Scan.  It was a pleasure to see you today!  Jackquline Denmark, M.D.

## 2021-06-13 LAB — CBC WITH DIFFERENTIAL/PLATELET
Basophils Absolute: 0.1 10*3/uL (ref 0.0–0.1)
Basophils Relative: 0.8 % (ref 0.0–3.0)
Eosinophils Absolute: 0.1 10*3/uL (ref 0.0–0.7)
Eosinophils Relative: 1.2 % (ref 0.0–5.0)
HCT: 30 % — ABNORMAL LOW (ref 39.0–52.0)
Hemoglobin: 9 g/dL — ABNORMAL LOW (ref 13.0–17.0)
Lymphocytes Relative: 14.7 % (ref 12.0–46.0)
Lymphs Abs: 1.4 10*3/uL (ref 0.7–4.0)
MCHC: 30 g/dL (ref 30.0–36.0)
MCV: 71.3 fl — ABNORMAL LOW (ref 78.0–100.0)
Monocytes Absolute: 0.7 10*3/uL (ref 0.1–1.0)
Monocytes Relative: 7.9 % (ref 3.0–12.0)
Neutro Abs: 6.9 10*3/uL (ref 1.4–7.7)
Neutrophils Relative %: 75.4 % (ref 43.0–77.0)
Platelets: 382 10*3/uL (ref 150.0–400.0)
RBC: 4.21 Mil/uL — ABNORMAL LOW (ref 4.22–5.81)
RDW: 27.9 % — ABNORMAL HIGH (ref 11.5–15.5)
WBC: 9.2 10*3/uL (ref 4.0–10.5)

## 2021-06-13 LAB — COMPREHENSIVE METABOLIC PANEL
ALT: 8 U/L (ref 0–53)
AST: 11 U/L (ref 0–37)
Albumin: 4 g/dL (ref 3.5–5.2)
Alkaline Phosphatase: 61 U/L (ref 39–117)
BUN: 16 mg/dL (ref 6–23)
CO2: 27 mEq/L (ref 19–32)
Calcium: 9.1 mg/dL (ref 8.4–10.5)
Chloride: 104 mEq/L (ref 96–112)
Creatinine, Ser: 1.07 mg/dL (ref 0.40–1.50)
GFR: 60.27 mL/min (ref >60.00)
Glucose, Bld: 119 mg/dL — ABNORMAL HIGH (ref 70–99)
Potassium: 3.5 mEq/L (ref 3.5–5.1)
Sodium: 139 mEq/L (ref 135–145)
Total Bilirubin: 0.3 mg/dL (ref 0.2–1.2)
Total Protein: 6.9 g/dL (ref 6.0–8.3)

## 2021-06-15 DIAGNOSIS — I10 Essential (primary) hypertension: Secondary | ICD-10-CM | POA: Diagnosis not present

## 2021-06-15 DIAGNOSIS — I480 Paroxysmal atrial fibrillation: Secondary | ICD-10-CM | POA: Diagnosis not present

## 2021-06-15 DIAGNOSIS — D62 Acute posthemorrhagic anemia: Secondary | ICD-10-CM | POA: Diagnosis not present

## 2021-06-15 DIAGNOSIS — K922 Gastrointestinal hemorrhage, unspecified: Secondary | ICD-10-CM | POA: Diagnosis not present

## 2021-06-22 ENCOUNTER — Ambulatory Visit: Payer: PRIVATE HEALTH INSURANCE | Admitting: Gastroenterology

## 2021-06-23 ENCOUNTER — Encounter (HOSPITAL_BASED_OUTPATIENT_CLINIC_OR_DEPARTMENT_OTHER): Payer: Self-pay

## 2021-06-23 ENCOUNTER — Other Ambulatory Visit: Payer: Self-pay

## 2021-06-23 ENCOUNTER — Ambulatory Visit (HOSPITAL_BASED_OUTPATIENT_CLINIC_OR_DEPARTMENT_OTHER): Admission: RE | Admit: 2021-06-23 | Payer: Medicare Other | Source: Ambulatory Visit

## 2021-06-23 ENCOUNTER — Ambulatory Visit (HOSPITAL_BASED_OUTPATIENT_CLINIC_OR_DEPARTMENT_OTHER)
Admission: RE | Admit: 2021-06-23 | Discharge: 2021-06-23 | Disposition: A | Discharge status: Home / Self Care | Payer: Medicare Other | Source: Ambulatory Visit | Attending: Gastroenterology | Admitting: Gastroenterology

## 2021-06-23 DIAGNOSIS — R634 Abnormal weight loss: Secondary | ICD-10-CM | POA: Insufficient documentation

## 2021-06-23 DIAGNOSIS — D500 Iron deficiency anemia secondary to blood loss (chronic): Secondary | ICD-10-CM | POA: Insufficient documentation

## 2021-06-23 DIAGNOSIS — R1084 Generalized abdominal pain: Secondary | ICD-10-CM

## 2021-06-23 DIAGNOSIS — R109 Unspecified abdominal pain: Secondary | ICD-10-CM | POA: Diagnosis not present

## 2021-06-23 MED ORDER — IOHEXOL 300 MG/ML  SOLN
80.0000 mL | Freq: Once | INTRAMUSCULAR | Status: AC | PRN
  Administered 2021-06-23: 80 mL via INTRAVENOUS

## 2021-06-23 NOTE — Progress Notes (Signed)
New order for CT abd pel with contrast only

## 2021-06-26 ENCOUNTER — Telehealth: Payer: Self-pay

## 2021-06-26 ENCOUNTER — Telehealth: Payer: Self-pay | Admitting: Gastroenterology

## 2021-06-26 NOTE — Telephone Encounter (Signed)
Proceed with cardio clearance with Dr Bettina Gavia ASAP- see last note Then, EGD/colon at [facility] with Miralax prep.  EGD to clear upper trac (r/o PUD) and colon (abn CT) ASAP with me RG

## 2021-06-26 NOTE — Telephone Encounter (Signed)
   [facility] Pre-operative Risk Assessment    Patient Name: Jorge Lawrence  DOB: [birth year] MRN: [id number]  HEARTCARE STAFF:  - IMPORTANT!!!!!! Under Visit Info/Reason for Call, type in Other and utilize the format Clearance MM/DD/YY or Clearance TBD. Do not use dashes or single digits. - Please review there is not already an duplicate clearance open for this procedure. - If request is for dental extraction, please clarify the # of teeth to be extracted. - If the patient is currently at the dentist's office, call Pre-Op Callback Staff (MA/nurse) to input urgent request.  - If the patient is not currently in the dentist office, please route to the Pre-Op pool.  Request for surgical clearance:  What type of surgery is being performed? EGD and Colonoscopy  When is this surgery scheduled? TBD  What type of clearance is required (medical clearance vs. Pharmacy clearance to hold med vs. Both)? Medical  Are there any medications that need to be held prior to surgery and how long?   Practice name and name of physician performing surgery? [facility]- Dr. Lyndel Safe  What is the office phone number? [phone]   7.   What is the office fax number? [phone] Attention: Ammie Eversole  8.   Anesthesia type (None, local, MAC, general) ?    Lowella Grip 06/26/2021, 3:28 PM  _________________________________________________________________   (provider comments below)

## 2021-06-26 NOTE — Telephone Encounter (Signed)
Hi Dr. Lyndel Safe,  Received a call from [facility] at [facility] with a report on this patient.  It is as follows:  CT Abdomen and Pelvis -- colonic wall thickening in the hepatic flexure.  Highly suspicious of cancer.  Recommend colonoscopy.  Thank you.

## 2021-06-26 NOTE — Telephone Encounter (Signed)
Pre-operative Risk Assessment     Jorge Lawrence Feb 17, 1927 [id number]  Procedure: EGD and Colonoscopy Anesthesia type:  MAC Procedure Date: TO BE DETERMINED AFTER CLEARANCE Provider: Dr. Lyndel Safe  Type of Clearance needed: Cardiac/Medical  Please review request and advise by either responding to this message or by sending your response to the fax # provided below.  Thank you,  [facility]  Phone: [phone] Fax: [phone] ATTENTION: Leannah Guse, LPN

## 2021-06-26 NOTE — Telephone Encounter (Signed)
Chart Review Routing History  Recipients Sent On Sent By Routed Reports   Dr. Shirlee More   Dr. Shirlee More   06/26/2021  9:52 AM Aleatha Borer, LPN Telephone on 09/90/6893 with Jackquline Denmark, MD      Cover Page Message : Please provide Cardiac/Medical Clearance ASAP

## 2021-06-28 DIAGNOSIS — I480 Paroxysmal atrial fibrillation: Secondary | ICD-10-CM | POA: Diagnosis not present

## 2021-06-28 NOTE — Telephone Encounter (Signed)
Primary Cardiologist:Muhammad Fletcher Anon, MD  Chart reviewed as part of pre-operative protocol coverage. Because of Jorge Lawrence past medical history and time since last visit, he/she will require a follow-up visit in order to better assess preoperative cardiovascular risk.  Pre-op covering staff: - Please schedule appointment and call patient to inform them. - Please contact requesting surgeon's office via preferred method (i.e, phone, fax) to inform them of need for appointment prior to surgery.  If applicable, this message will also be routed to pharmacy pool and/or primary cardiologist for input on holding anticoagulant/antiplatelet agent as requested below so that this information is available at time of patient's appointment.   Deberah Pelton, NP  06/28/2021, 8:43 AM

## 2021-06-28 NOTE — Telephone Encounter (Signed)
Pt has appt 07/11/21 with Dr. Bettina Gavia as NEW PT for pre op. Will forward notes to MD for appt. Will send FYI to requesting office pt has appt.

## 2021-06-30 ENCOUNTER — Encounter: Payer: Self-pay | Admitting: Cardiology

## 2021-07-04 DIAGNOSIS — E538 Deficiency of other specified B group vitamins: Secondary | ICD-10-CM | POA: Insufficient documentation

## 2021-07-04 DIAGNOSIS — R0609 Other forms of dyspnea: Secondary | ICD-10-CM | POA: Insufficient documentation

## 2021-07-04 DIAGNOSIS — M810 Age-related osteoporosis without current pathological fracture: Secondary | ICD-10-CM | POA: Insufficient documentation

## 2021-07-04 DIAGNOSIS — R634 Abnormal weight loss: Secondary | ICD-10-CM | POA: Insufficient documentation

## 2021-07-09 NOTE — Progress Notes (Signed)
Cardiology Office Note:    Date:  07/11/2021   ID:  Jorge Lawrence, DOB [birth year], MRN [id number]  PCP:  Serita Grammes, MD  Cardiologist:  Shirlee More, MD   Referring MD: Serita Grammes, MD  ASSESSMENT:    1. Paroxysmal atrial fibrillation (HCC)   2. Hypertensive heart disease without heart failure   3. Iron deficiency anemia due to chronic blood loss    PLAN:    In order of problems listed above:  From a cardiology perspective he is optimized for GI endoscopic procedures at family's request we will recheck renal function CBC today Stable blood pressure well controlled continue ARB thiazide combination He is on iron there is a potential for intestinal blockade from taking a daily and I asked him to reduce his iron to 325 mg every other day.  If refractory to oral iron he would need intravenous through hematology.  Next appointment as needed   Medication Adjustments/Labs and Tests Ordered: Current medicines are reviewed at length with the patient today.  Concerns regarding medicines are outlined above.  Orders Placed This Encounter  Procedures   CBC   Basic metabolic panel   EKG 40-[id number]    No orders of the defined types were placed in this encounter.    Chief Complaint  Patient presents with   Pre-op Exam   Atrial Fibrillation   Anemia    History of Present Illness:    Jorge Lawrence is a 87 y.o. male who is being seen today for the evaluation of iron deficiency anemia and atrial fibrillation previously anticoagulated and hypertension.  He has had anemia requiring transfusion and Eliquis was discontinued 06/07/2021.  He is seen today at the request of Dr. Lyndel Safe pending colonoscopy.  An echocardiogram in 2011 which was unremarkable normal left ventricular function. He was seen by Dr. Fletcher Anon in 2012 for his atrial fibrillation at that time was on Pradaxa as an anticoagulant. His most recent hemoglobin 9.0 quite microcytic MCV 71.3 Potassium 3.5 creatinine 1.07 GFR  60 cc/min He has an event monitor for 14 days reported 06/28/2021 that shows he is in sinus rhythm throughout.  There were no pauses of 3 seconds or greater no high degree AV node or sinus node exit block and ventricular and supraventricular ectopy was rare.  He has been on anticoagulant since at least 2014 and from the family's understanding he has had very little atrial arrhythmia.  Earlier this year in 20-Dec-2022 his wife died afterwards he was exhausted and his hemoglobin was 6.4 when he was transfused as an outpatient.  He has been on iron since then feels much better off his anticoagulant no edema shortness of breath chest pain palpitation or syncope.  He is tentatively scheduled for colonoscopy.  Otherwise he has no known heart disease congenital rheumatic and has had no recurrence of atrial fibrillation at the family's reecall  Past Medical History:  Diagnosis Date   Abnormal weight loss    B12 deficiency    Benign essential hypertension    Dyspnea on exertion    HLD (hyperlipidemia)    Osteoporosis    Paroxysmal atrial fibrillation (HCC)     Past Surgical History:  Procedure Laterality Date   NO PAST SURGERIES      Current Medications: Current Meds  Medication Sig   ferrous sulfate 325 (65 FE) MG tablet Take 325 mg by mouth every other day.   losartan-hydrochlorothiazide (HYZAAR) 100-12.5 MG per tablet Take 1 tablet by mouth daily.     omeprazole (  PRILOSEC) 20 MG capsule Take 20 mg by mouth at bedtime.     Allergies:   Aspirin   Social History   Socioeconomic History   Marital status: Married    Spouse name: Not on file   Number of children: 1   Years of education: Not on file   Highest education level: Not on file  Occupational History   Occupation: retired  Tobacco Use   Smoking status: Never   Smokeless tobacco: Never  Substance and Sexual Activity   Alcohol use: No   Drug use: No   Sexual activity: Not on file  Other Topics Concern   Not on file  Social  History Narrative   Not on file   Social Determinants of Health   Financial Resource Strain: Not on file  Food Insecurity: Not on file  Transportation Needs: Not on file  Physical Activity: Not on file  Stress: Not on file  Social Connections: Not on file     Family History: The patient's family history includes Colon cancer in his sister; Stomach cancer in his father.  ROS:   ROS Please see the history of present illness.     All other systems reviewed and are negative.  EKGs/Labs/Other Studies Reviewed:    The following studies were reviewed today:   EKG:  EKG is  ordered today.  The ekg ordered today is personally reviewed and demonstrates sinus rhythm normal EKG   Physical Exam:    VS:  BP (!) 120/52   Pulse 62   Ht 5\' 7"  (1.702 m)   Wt 167 lb 12.8 oz (76.1 kg)   SpO2 97%   BMI 26.28 kg/m     Wt Readings from Last 3 Encounters:  07/11/21 167 lb 12.8 oz (76.1 kg)  05/29/21 164 lb (74.4 kg)  06/12/21 167 lb 4 oz (75.9 kg)     GEN: Looks young for his age he has no pallor of the skin or membranes well nourished, well developed in no acute distress HEENT: Normal NECK: No JVD; No carotid bruits LYMPHATICS: No lymphadenopathy CARDIAC: RRR, no murmurs, rubs, gallops RESPIRATORY:  Clear to auscultation without rales, wheezing or rhonchi  ABDOMEN: Soft, non-tender, non-distended MUSCULOSKELETAL:  No edema; No deformity  SKIN: Warm and dry NEUROLOGIC:  Alert and oriented x 3 PSYCHIATRIC:  Normal affect     Signed, Shirlee More, MD  07/11/2021 2:51 PM    [facility]

## 2021-07-11 ENCOUNTER — Encounter: Payer: Self-pay | Admitting: Cardiology

## 2021-07-11 ENCOUNTER — Other Ambulatory Visit: Payer: Self-pay

## 2021-07-11 ENCOUNTER — Ambulatory Visit (INDEPENDENT_AMBULATORY_CARE_PROVIDER_SITE_OTHER): Payer: Medicare Other | Admitting: Cardiology

## 2021-07-11 VITALS — BP 120/52 | HR 62 | Ht 67.0 in | Wt 167.8 lb

## 2021-07-11 DIAGNOSIS — D500 Iron deficiency anemia secondary to blood loss (chronic): Secondary | ICD-10-CM | POA: Diagnosis not present

## 2021-07-11 DIAGNOSIS — I119 Hypertensive heart disease without heart failure: Secondary | ICD-10-CM

## 2021-07-11 DIAGNOSIS — I480 Paroxysmal atrial fibrillation: Secondary | ICD-10-CM | POA: Diagnosis not present

## 2021-07-11 NOTE — Patient Instructions (Signed)
Medication Instructions:  Your physician has recommended you make the following change in your medication:  DECREASE: Iron to every other day *If you need a refill on your cardiac medications before your next appointment, please call your pharmacy*   Lab Work: Your physician recommends that you return for lab work in: [place], BMP If you have labs (blood work) drawn today and your tests are completely normal, you will receive your results only by: [place] (if you have [place]) OR A paper copy in the mail If you have any lab test that is abnormal or we need to change your treatment, we will call you to review the results.   Testing/Procedures: None   Follow-Up: At [facility], you and your health needs are our priority.  As part of our continuing mission to provide you with exceptional heart care, we have created designated Provider Care Teams.  These Care Teams include your primary Cardiologist (physician) and Advanced Practice Providers (APPs -  Physician Assistants and Nurse Practitioners) who all work together to provide you with the care you need, when you need it.  We recommend signing up for the patient portal called "MyChart".  Sign up information is provided on this After Visit Summary.  MyChart is used to connect with patients for Virtual Visits (Telemedicine).  Patients are able to view lab/test results, encounter notes, upcoming appointments, etc.  Non-urgent messages can be sent to your provider as well.   To learn more about what you can do with MyChart, go to [email or web address].    Your next appointment:   As needed  The format for your next appointment:   In Person  Provider:   Shirlee More, MD    Other Instructions

## 2021-07-12 ENCOUNTER — Telehealth: Payer: Self-pay

## 2021-07-12 ENCOUNTER — Other Ambulatory Visit: Payer: Self-pay

## 2021-07-12 DIAGNOSIS — K219 Gastro-esophageal reflux disease without esophagitis: Secondary | ICD-10-CM

## 2021-07-12 DIAGNOSIS — D500 Iron deficiency anemia secondary to blood loss (chronic): Secondary | ICD-10-CM

## 2021-07-12 LAB — BASIC METABOLIC PANEL
BUN/Creatinine Ratio: 15 (ref 10–24)
BUN: 14 mg/dL (ref 10–36)
CO2: 23 mmol/L (ref 20–29)
Calcium: 9.6 mg/dL (ref 8.6–10.2)
Chloride: 104 mmol/L (ref 96–106)
Creatinine, Ser: 0.95 mg/dL (ref 0.76–1.27)
Glucose: 121 mg/dL — ABNORMAL HIGH (ref 70–99)
Potassium: 3.9 mmol/L (ref 3.5–5.2)
Sodium: 141 mmol/L (ref 134–144)
eGFR: 75 mL/min/{1.73_m2} (ref >59)

## 2021-07-12 LAB — CBC
Hematocrit: 39.7 % (ref 37.5–51.0)
Hemoglobin: 12 g/dL — ABNORMAL LOW (ref 13.0–17.7)
MCH: 24.1 pg — ABNORMAL LOW (ref 26.6–33.0)
MCHC: 30.2 g/dL — ABNORMAL LOW (ref 31.5–35.7)
MCV: 80 fL (ref 79–97)
Platelets: 294 10*3/uL (ref 150–450)
RBC: 4.97 x10E6/uL (ref 4.14–5.80)
RDW: 23.9 % — ABNORMAL HIGH (ref 11.6–15.4)
WBC: 7.5 10*3/uL (ref 3.4–10.8)

## 2021-07-12 NOTE — Telephone Encounter (Signed)
-----   Message from Richardo Priest, MD sent at 07/12/2021 10:33 AM EST ----- Good result hemoglobin near normal

## 2021-07-12 NOTE — Telephone Encounter (Signed)
Spoke with patients daughter regarding results and recommendation.  She verbalizes understanding and is agreeable to plan of care. Advised patient to call back with any issues or concerns.  

## 2021-07-24 ENCOUNTER — Ambulatory Visit: Payer: PRIVATE HEALTH INSURANCE | Admitting: Cardiology

## 2021-08-03 ENCOUNTER — Other Ambulatory Visit (INDEPENDENT_AMBULATORY_CARE_PROVIDER_SITE_OTHER): Payer: Medicare Other

## 2021-08-03 ENCOUNTER — Encounter: Payer: Self-pay | Admitting: Gastroenterology

## 2021-08-03 ENCOUNTER — Ambulatory Visit (AMBULATORY_SURGERY_CENTER): Payer: Medicare Other | Admitting: Gastroenterology

## 2021-08-03 ENCOUNTER — Telehealth: Payer: Self-pay | Admitting: Gastroenterology

## 2021-08-03 VITALS — BP 118/55 | HR 79 | Temp 98.0°F | Resp 13 | Ht 68.0 in | Wt 167.0 lb

## 2021-08-03 DIAGNOSIS — D509 Iron deficiency anemia, unspecified: Secondary | ICD-10-CM | POA: Diagnosis not present

## 2021-08-03 DIAGNOSIS — R1084 Generalized abdominal pain: Secondary | ICD-10-CM | POA: Diagnosis not present

## 2021-08-03 DIAGNOSIS — K219 Gastro-esophageal reflux disease without esophagitis: Secondary | ICD-10-CM | POA: Diagnosis not present

## 2021-08-03 DIAGNOSIS — R634 Abnormal weight loss: Secondary | ICD-10-CM | POA: Diagnosis not present

## 2021-08-03 DIAGNOSIS — K449 Diaphragmatic hernia without obstruction or gangrene: Secondary | ICD-10-CM

## 2021-08-03 DIAGNOSIS — B9681 Helicobacter pylori [H. pylori] as the cause of diseases classified elsewhere: Secondary | ICD-10-CM

## 2021-08-03 DIAGNOSIS — D124 Benign neoplasm of descending colon: Secondary | ICD-10-CM

## 2021-08-03 DIAGNOSIS — K6389 Other specified diseases of intestine: Secondary | ICD-10-CM

## 2021-08-03 DIAGNOSIS — K640 First degree hemorrhoids: Secondary | ICD-10-CM | POA: Diagnosis not present

## 2021-08-03 DIAGNOSIS — K5730 Diverticulosis of large intestine without perforation or abscess without bleeding: Secondary | ICD-10-CM | POA: Diagnosis not present

## 2021-08-03 DIAGNOSIS — K2950 Unspecified chronic gastritis without bleeding: Secondary | ICD-10-CM | POA: Diagnosis not present

## 2021-08-03 DIAGNOSIS — K2970 Gastritis, unspecified, without bleeding: Secondary | ICD-10-CM | POA: Diagnosis not present

## 2021-08-03 DIAGNOSIS — C183 Malignant neoplasm of hepatic flexure: Secondary | ICD-10-CM

## 2021-08-03 DIAGNOSIS — I4891 Unspecified atrial fibrillation: Secondary | ICD-10-CM | POA: Diagnosis not present

## 2021-08-03 DIAGNOSIS — I10 Essential (primary) hypertension: Secondary | ICD-10-CM | POA: Diagnosis not present

## 2021-08-03 MED ORDER — SODIUM CHLORIDE 0.9 % IV SOLN: 500.0000 mL | Freq: Once | INTRAVENOUS | Status: DC

## 2021-08-03 NOTE — Progress Notes (Signed)
Called to room to assist during endoscopic procedure.  Patient ID and intended procedure confirmed with present staff. Received instructions for my participation in the procedure from the performing physician.  

## 2021-08-03 NOTE — Progress Notes (Signed)
Cw vitals and JM IV.

## 2021-08-03 NOTE — Op Note (Signed)
[place] Patient Name: Jorge Lawrence Procedure Date: 08/03/2021 3:55 PM MRN: [id number] Endoscopist: Jackquline Denmark , MD Age: 87 Referring MD:  Date of Birth: 12-27-1926 Gender: Male Account #: [id number] Procedure:                Upper GI endoscopy Indications:              1. GERD 2. IDA Medicines:                Monitored Anesthesia Care Procedure:                Pre-Anesthesia Assessment:                           - Prior to the procedure, a History and Physical                            was performed, and patient medications and                            allergies were reviewed. The patient's tolerance of                            previous anesthesia was also reviewed. The risks                            and benefits of the procedure and the sedation                            options and risks were discussed with the patient.                            All questions were answered, and informed consent                            was obtained. Prior Anticoagulants: The patient has                            taken no previous anticoagulant or antiplatelet                            agents. ASA Grade Assessment: III - A patient with                            severe systemic disease. After reviewing the risks                            and benefits, the patient was deemed in                            satisfactory condition to undergo the procedure.                           After obtaining informed consent, the endoscope was  passed under direct vision. Throughout the                            procedure, the patient's blood pressure, pulse, and                            oxygen saturations were monitored continuously. The                            GIF [id number] #[id number] was introduced through the                            mouth, and advanced to the second part of duodenum.                            The upper GI endoscopy was accomplished  without                            difficulty. The patient tolerated the procedure                            well. Scope In: Scope Out: Findings:                 The examined esophagus was normal with well-defined                            Z-line at 36 cm, examined by NBI.                           A small hiatal hernia was present.                           Localized mild inflammation characterized by                            erythema was found in the gastric antrum. Biopsies                            were taken with a cold forceps for histology.                           The examined duodenum was normal. Biopsies for                            histology were taken with a cold forceps for                            evaluation of celiac disease. Complications:            No immediate complications. Estimated Blood Loss:     Estimated blood loss: none. Impression:               - Small hiatal hernia.                           -  Gastritis. Biopsied.                           - Normal examined duodenum. Biopsied. Recommendation:           - Patient has a contact number available for                            emergencies. The signs and symptoms of potential                            delayed complications were discussed with the                            patient. Return to normal activities tomorrow.                            Written discharge instructions were provided to the                            patient.                           - Resume previous diet.                           - Continue present medications.                           - Await pathology results.                           - The findings and recommendations were discussed                            with the patient's family. Jackquline Denmark, MD 08/03/2021 4:27:45 PM This report has been signed electronically.

## 2021-08-03 NOTE — Patient Instructions (Addendum)
Handouts given for polyps and diverticulosis.  CBC, CMP AND CEA  TODAY OR ASAP   YOU HAD AN ENDOSCOPIC PROCEDURE TODAY AT [facility]:   Refer to the procedure report that was given to you for any specific questions about what was found during the examination.  If the procedure report does not answer your questions, please call your gastroenterologist to clarify.  If you requested that your care partner not be given the details of your procedure findings, then the procedure report has been included in a sealed envelope for you to review at your convenience later.  YOU SHOULD EXPECT: Some feelings of bloating in the abdomen. Passage of more gas than usual.  Walking can help get rid of the air that was put into your GI tract during the procedure and reduce the bloating. If you had a lower endoscopy (such as a colonoscopy or flexible sigmoidoscopy) you may notice spotting of blood in your stool or on the toilet paper. If you underwent a bowel prep for your procedure, you may not have a normal bowel movement for a few days.  Please Note:  You might notice some irritation and congestion in your nose or some drainage.  This is from the oxygen used during your procedure.  There is no need for concern and it should clear up in a day or so.  SYMPTOMS TO REPORT IMMEDIATELY:  Following lower endoscopy (colonoscopy or flexible sigmoidoscopy):  Excessive amounts of blood in the stool  Significant tenderness or worsening of abdominal pains  Swelling of the abdomen that is new, acute  Fever of 100F or higher  Following upper endoscopy (EGD)  Vomiting of blood or coffee ground material  New chest pain or pain under the shoulder blades  Painful or persistently difficult swallowing  New shortness of breath  Black, tarry-looking stools  For urgent or emergent issues, a gastroenterologist can be reached at any hour by calling [phone]. Do not use MyChart messaging for urgent concerns.     DIET:  We do recommend a small meal at first, but then you may proceed to your regular diet.  Drink plenty of fluids but you should avoid alcoholic beverages for 24 hours.  ACTIVITY:  You should plan to take it easy for the rest of today and you should NOT DRIVE or use heavy machinery until tomorrow (because of the sedation medicines used during the test).    FOLLOW UP: Our staff will call the number listed on your records 48-72 hours following your procedure to check on you and address any questions or concerns that you may have regarding the information given to you following your procedure. If we do not reach you, we will leave a message.  We will attempt to reach you two times.  During this call, we will ask if you have developed any symptoms of COVID 19. If you develop any symptoms (ie: fever, flu-like symptoms, shortness of breath, cough etc.) before then, please call [phone].  If you test positive for Covid 19 in the 2 weeks post procedure, please call and report this information to [place].    If any biopsies were taken you will be contacted by phone or by letter within the next 1-3 weeks.  Please call us at [phone] if you have not heard about the biopsies in 3 weeks.    SIGNATURES/CONFIDENTIALITY: You and/or your care partner have signed paperwork which will be entered into your electronic medical record.  These signatures attest  to the fact that that the information above on your After Visit Summary has been reviewed and is understood.  Full responsibility of the confidentiality of this discharge information lies with you and/or your care-partner.

## 2021-08-03 NOTE — Op Note (Signed)
[facility] Patient Name: Jorge Lawrence Procedure Date: 08/03/2021 3:55 PM MRN: [id number] Endoscopist: Jackquline Denmark , MD Age: 87 Referring MD:  Date of Birth: 1926-10-29 Gender: Male Account #: [id number] Procedure:                Colonoscopy Indications:              Iron deficiency anemia, Abnormal CT of the GI tract                            showing hepatic flexure mass. Medicines:                Monitored Anesthesia Care Procedure:                Pre-Anesthesia Assessment:                           - Prior to the procedure, a History and Physical                            was performed, and patient medications and                            allergies were reviewed. The patient's tolerance of                            previous anesthesia was also reviewed. The risks                            and benefits of the procedure and the sedation                            options and risks were discussed with the patient.                            All questions were answered, and informed consent                            was obtained. Prior Anticoagulants: The patient has                            taken no previous anticoagulant or antiplatelet                            agents. ASA Grade Assessment: III - A patient with                            severe systemic disease. After reviewing the risks                            and benefits, the patient was deemed in                            satisfactory condition to undergo the procedure.  After obtaining informed consent, the colonoscope                            was passed under direct vision. Throughout the                            procedure, the patient's blood pressure, pulse, and                            oxygen saturations were monitored continuously. The                            PCF-HQ190L Colonoscope was introduced through the                            anus with the intention of  advancing to the cecum.                            The scope was advanced to the hepatic flexure                            before the procedure was aborted as scope could not                            be passed beyond the mass. Medications were given.                            The colonoscopy was performed without difficulty.                            The patient tolerated the procedure well. The                            quality of the bowel preparation was good. Scope In: Scope Out: Findings:                 A circumferential fungating, infiltrative and                            ulcerated partially obstructing "apple core" mass                            was found at the hepatic flexure with luminal                            diameter of approximately 8 mm. This did not allow                            passage of pediatric colonoscope (luminal diameter                            11.5 mm). Mucosa was biopsied with a cold forceps  for histology with some difficulty. Area just                            distal to the mass was tattooed with an injection                            of 3 mL of Spot (carbon black).                           A 6 mm polyp was found in the mid descending colon.                            The polyp was sessile. The polyp was removed with a                            cold snare. Resection and retrieval were complete.                           Multiple medium-mouthed diverticula were found in                            the sigmoid colon with luminal narrowing consistent                            with muscular hypertrophy. No endoscopic evidence                            of diverticulitis.                           Non-bleeding internal hemorrhoids were found during                            retroflexion. The hemorrhoids were small and Grade                            I (internal hemorrhoids that do not prolapse).                            The exam was otherwise without abnormality on                            direct and retroflexion views. Complications:            No immediate complications. Estimated Blood Loss:     Estimated blood loss: none. Impression:               - Malignant partially obstructing tumor at the                            hepatic flexure. Biopsied. Tattooed.                           - One 6 mm polyp in the mid descending colon,  removed with a cold snare. Resected and retrieved.                           - Moderate to severe sigmoid diverticulosis                           - Non-bleeding internal hemorrhoids.                           - The examination was otherwise normal on direct                            and retroflexion views. Recommendation:           - Patient has a contact number available for                            emergencies. The signs and symptoms of potential                            delayed complications were discussed with the                            patient. Return to normal activities tomorrow.                            Written discharge instructions were provided to the                            patient.                           - Resume previous diet.                           - Continue present medications.                           - Await pathology results (have been sent [facility])                           - Check CBC, CMP, CEA today if possible.                           - Recommend surgical consultation.                           - The findings and recommendations were discussed                            with [facility]. Jackquline Denmark, MD 08/03/2021 4:37:44 PM This report has been signed electronically.

## 2021-08-03 NOTE — Telephone Encounter (Signed)
Fax received today (08/03/21) from cardiology indicating pt had a NP appt scheduled with Dr. Bettina Gavia on 11/29 but did not indicate whether or not pt was cleared. As written below, procedure date was pending receipt of cardiac clearance. Upon further review of pt chart, appears we were still awaiting clearance. However, it appears pt was scheduled on 07/12/21 for EGD and colon with Dr. Lyndel Safe on 08/03/21 @ 3:30pm, without having received cardiac clearance. Further review of pt chart also indicates Dr. Bettina Gavia cleared pt PENDING results of CBC and CMP. Did not appear these results were communicated to our office. However, after reviewing results, appears labs are unremarkable and may proceed with procedure as planned for 08/03/21. Routing this message to Dr. Lyndel Safe for continuity of care purposes given procedure is occurring today.

## 2021-08-03 NOTE — Progress Notes (Signed)
Report to PACU, RN, vss, BBS= Clear.  

## 2021-08-03 NOTE — Progress Notes (Signed)
Chief Complaint: Symptomatic anemia  Referring Provider:  Serita Grammes, MD      ASSESSMENT AND PLAN;   #1. IDA with H+ stools  #2. Abdo pain/Wt loss  #3. A Fib (off eliquis since 10/26)  Plan: -EGD/colon with mirlax after cardio clearence (Dr Cleda Daub will make appt)   I discussed EGD/Colonoscopy- the indications, risks, alternatives and potential complications including, but not limited to, bleeding, infection, reaction to medication, damage to internal organs, cardiac and/or pulmonary problems, and perforation requiring surgery (1 to 2 in 1000). The possibility that significant findings could be missed was explained. All ? were answered. The patient gives consent to proceed. HPI:    Jorge Lawrence is a 87 y.o. male  With H/O A Fib on eliquis, HTN  With generalized weakness, better after transfusion Hb 14 (June 2022) to 6.5 s/p 2U to 9.0 with heme + brown stools. Low MCV Stopped eliquis 10/26 Started on omeprazole 20mg  po qd Patient does feel better Sent to GI clinic for further evaluation.  He does have occasional heartburn, occasional upper abdominal discomfort/pain after eating.  No nausea, vomiting, regurgitation, odynophagia or dysphagia.  No significant diarrhea or constipation.  No melena or hematochezia.  Has weight loss as described below.  Wt Readings from Last 3 Encounters:  07/11/21 167 lb 12.8 oz (76.1 kg)  05/29/21 164 lb (74.4 kg)  06/12/21 167 lb 4 oz (75.9 kg)  Review for the labs -Iron studies consistent with IDA with ferritin 4, iron 19%, saturation 6% -Hb 6.5, MCV 70, platelets 400 on 06/01/2021.  Normal B12 227, normal TSH, normal LFTs  Previous GI procedures -Had colonoscopy in 1999, repeat 2009 which showed diminutive tubular adenoma.  He had follow-up colonoscopy in 2012 which was unremarkable. Dr Lyda Tomassi.  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  Past Medical History:  Diagnosis Date   Abnormal weight loss    B12 deficiency     Benign essential hypertension    Dyspnea on exertion    HLD (hyperlipidemia)    Osteoporosis    Paroxysmal atrial fibrillation (HCC)     Past Surgical History:  Procedure Laterality Date   NO PAST SURGERIES      Family History  Problem Relation Age of Onset   Stomach cancer Father        died at age 32   Colon cancer Sister     Social History   Tobacco Use   Smoking status: Never   Smokeless tobacco: Never  Substance Use Topics   Alcohol use: No   Drug use: No    Current Outpatient Medications  Medication Sig Dispense Refill   ferrous sulfate 325 (65 FE) MG tablet Take 325 mg by mouth every other day.     losartan-hydrochlorothiazide (HYZAAR) 100-12.5 MG per tablet Take 1 tablet by mouth daily.       omeprazole (PRILOSEC) 20 MG capsule Take 20 mg by mouth at bedtime.     No current facility-administered medications for this visit.    Allergies  Allergen Reactions   Aspirin     Review of Systems:  Constitutional: Denies fever, chills, diaphoresis, appetite change and had fatigue.  HEENT: Denies photophobia, eye pain, redness, hearing loss, ear pain, congestion, sore throat, rhinorrhea, sneezing, mouth sores, neck pain, neck stiffness and tinnitus.   Respiratory: Denies SOB, DOE, cough, chest tightness,  and wheezing.   Cardiovascular: Denies chest pain, palpitations and leg swelling.  Genitourinary: Denies dysuria, urgency, frequency, hematuria, flank pain and difficulty urinating.  Musculoskeletal: Denies  myalgias, back pain, joint swelling, arthralgias and gait problem.  Skin: No rash.  Neurological: Denies dizziness, seizures, syncope, weakness, light-headedness, numbness and headaches.  Hematological: Denies adenopathy. Easy bruising, personal or family bleeding history  Psychiatric/Behavioral: No anxiety or depression     Physical Exam:    There were no vitals taken for this visit. Wt Readings from Last 3 Encounters:  07/11/21 167 lb 12.8 oz (76.1 kg)   05/29/21 164 lb (74.4 kg)  06/12/21 167 lb 4 oz (75.9 kg)   Constitutional:  Well-developed, in no acute distress. Psychiatric: Normal mood and affect. Behavior is normal. HEENT: Pupils normal.  Conjunctivae are normal. No scleral icterus. Cardiovascular: Normal rate, regular rhythm. No edema Pulmonary/chest: Effort normal and breath sounds normal. No wheezing, rales or rhonchi. Abdominal: Soft, nondistended. Nontender. Bowel sounds active throughout. There are no masses palpable. No hepatomegaly. Rectal: Deferred Neurological: Alert and oriented to person place and time. Skin: Skin is warm and dry. No rashes noted.    Carmell Austria, MD 08/03/2021, 3:26 PM  Cc: Serita Grammes, MD

## 2021-08-03 NOTE — Telephone Encounter (Signed)
Remo Lipps,  Pl make first available appointment with Dr. Foye Spurling ([facility])  RG

## 2021-08-04 LAB — COMPREHENSIVE METABOLIC PANEL
ALT: 15 U/L (ref 0–53)
AST: 21 U/L (ref 0–37)
Albumin: 3.8 g/dL (ref 3.5–5.2)
Alkaline Phosphatase: 67 U/L (ref 39–117)
BUN: 16 mg/dL (ref 6–23)
CO2: 23 mEq/L (ref 19–32)
Calcium: 9.3 mg/dL (ref 8.4–10.5)
Chloride: 101 mEq/L (ref 96–112)
Creatinine, Ser: 1.1 mg/dL (ref 0.40–1.50)
GFR: 58.24 mL/min — ABNORMAL LOW (ref >60.00)
Glucose, Bld: 80 mg/dL (ref 70–99)
Potassium: 3.5 mEq/L (ref 3.5–5.1)
Sodium: 137 mEq/L (ref 135–145)
Total Bilirubin: 0.4 mg/dL (ref 0.2–1.2)
Total Protein: 7.2 g/dL (ref 6.0–8.3)

## 2021-08-04 LAB — CEA: CEA: 4.6 ng/mL — ABNORMAL HIGH

## 2021-08-04 NOTE — Telephone Encounter (Signed)
Dr. Leanne Lovely office contacted to make appointment. Tonya at office states that appointment has already been scheduled by Pts Daughter  for Dr. Coralie Keens on 08/17/2020 @ 9:45  Records requested and Faxed to [phone]

## 2021-08-08 ENCOUNTER — Telehealth: Payer: Self-pay

## 2021-08-08 NOTE — Telephone Encounter (Signed)
°  Follow up Call-  Call back number 08/03/2021  Post procedure Call Back phone  # [phone]  Permission to leave phone message Yes  Some recent data might be hidden     Follow up call made.  NALM

## 2021-08-17 ENCOUNTER — Telehealth: Payer: Self-pay

## 2021-08-17 DIAGNOSIS — C182 Malignant neoplasm of ascending colon: Secondary | ICD-10-CM | POA: Diagnosis not present

## 2021-08-17 DIAGNOSIS — C183 Malignant neoplasm of hepatic flexure: Secondary | ICD-10-CM | POA: Diagnosis not present

## 2021-08-17 NOTE — Telephone Encounter (Signed)
° °  Pre-operative Risk Assessment    Patient Name: Jorge Lawrence  DOB: 1927-05-20 MRN: [id number]      Request for Surgical Clearance    Procedure:   Lap right hemicolectomy, possibly open  Date of Surgery:  Clearance TBD                                 Surgeon:  Dr. Noberto Retort   Surgeon's Group or Practice Name:  [facility].  Phone number:  [phone] Fax number:  [phone]   Type of Clearance Requested:   - Medical    Type of Anesthesia:  General    Additional requests/questions:  Please advise surgeon/provider what medications should be held.  Signed, Gita Kudo   08/17/2021, 3:30 PM

## 2021-08-18 NOTE — Telephone Encounter (Signed)
° °  Primary Cardiologist: Kathlyn Sacramento, MD  Chart reviewed as part of pre-operative protocol coverage. Given past medical history and time since last visit, based on ACC/AHA guidelines, Jorge Lawrence would be at acceptable risk for the planned procedure without further cardiovascular testing.   His RCRI is a class II risk, 0.9% risk of major cardiac event.  I will route this recommendation to the requesting party via Epic fax function and remove from pre-op pool.  Please call with questions.  Jossie Ng. Analisia Kingsford NP-C    08/18/2021, 10:36 AM [place] [address] Office [phone] Fax [phone]

## 2021-08-28 DIAGNOSIS — M47812 Spondylosis without myelopathy or radiculopathy, cervical region: Secondary | ICD-10-CM | POA: Diagnosis not present

## 2021-08-28 DIAGNOSIS — E538 Deficiency of other specified B group vitamins: Secondary | ICD-10-CM | POA: Diagnosis not present

## 2021-08-28 DIAGNOSIS — C772 Secondary and unspecified malignant neoplasm of intra-abdominal lymph nodes: Secondary | ICD-10-CM | POA: Diagnosis not present

## 2021-08-28 DIAGNOSIS — J984 Other disorders of lung: Secondary | ICD-10-CM | POA: Diagnosis not present

## 2021-08-28 DIAGNOSIS — K6389 Other specified diseases of intestine: Secondary | ICD-10-CM | POA: Diagnosis not present

## 2021-08-28 DIAGNOSIS — K219 Gastro-esophageal reflux disease without esophagitis: Secondary | ICD-10-CM | POA: Diagnosis not present

## 2021-08-28 DIAGNOSIS — I4891 Unspecified atrial fibrillation: Secondary | ICD-10-CM | POA: Diagnosis not present

## 2021-08-28 DIAGNOSIS — E785 Hyperlipidemia, unspecified: Secondary | ICD-10-CM | POA: Diagnosis not present

## 2021-08-28 DIAGNOSIS — Z7901 Long term (current) use of anticoagulants: Secondary | ICD-10-CM | POA: Diagnosis not present

## 2021-08-28 DIAGNOSIS — Z79899 Other long term (current) drug therapy: Secondary | ICD-10-CM | POA: Diagnosis not present

## 2021-08-28 DIAGNOSIS — J90 Pleural effusion, not elsewhere classified: Secondary | ICD-10-CM | POA: Diagnosis not present

## 2021-08-28 DIAGNOSIS — K4090 Unilateral inguinal hernia, without obstruction or gangrene, not specified as recurrent: Secondary | ICD-10-CM | POA: Diagnosis not present

## 2021-08-28 DIAGNOSIS — C183 Malignant neoplasm of hepatic flexure: Secondary | ICD-10-CM | POA: Diagnosis not present

## 2021-08-28 DIAGNOSIS — N400 Enlarged prostate without lower urinary tract symptoms: Secondary | ICD-10-CM | POA: Diagnosis not present

## 2021-08-28 DIAGNOSIS — I2510 Atherosclerotic heart disease of native coronary artery without angina pectoris: Secondary | ICD-10-CM | POA: Diagnosis not present

## 2021-08-28 DIAGNOSIS — G8918 Other acute postprocedural pain: Secondary | ICD-10-CM | POA: Diagnosis not present

## 2021-08-28 DIAGNOSIS — C182 Malignant neoplasm of ascending colon: Secondary | ICD-10-CM | POA: Diagnosis not present

## 2021-08-28 DIAGNOSIS — D630 Anemia in neoplastic disease: Secondary | ICD-10-CM | POA: Diagnosis not present

## 2021-08-28 DIAGNOSIS — I10 Essential (primary) hypertension: Secondary | ICD-10-CM | POA: Diagnosis not present

## 2021-08-28 DIAGNOSIS — D509 Iron deficiency anemia, unspecified: Secondary | ICD-10-CM | POA: Diagnosis not present

## 2021-08-28 DIAGNOSIS — M810 Age-related osteoporosis without current pathological fracture: Secondary | ICD-10-CM | POA: Diagnosis not present

## 2021-08-28 DIAGNOSIS — K314 Gastric diverticulum: Secondary | ICD-10-CM | POA: Diagnosis not present

## 2021-09-01 NOTE — Telephone Encounter (Signed)
error 

## 2021-09-14 ENCOUNTER — Telehealth: Payer: Self-pay | Admitting: Oncology

## 2021-09-14 NOTE — Telephone Encounter (Signed)
Contacted pt to schedule an appt. Spoke with Suanne Marker (daughter), but I was unsuccessful in getting an appt scheudled. Suanne Marker will call me back since she is in the grocery store.

## 2021-09-27 ENCOUNTER — Other Ambulatory Visit: Payer: Self-pay | Admitting: Oncology

## 2021-09-27 DIAGNOSIS — C183 Malignant neoplasm of hepatic flexure: Secondary | ICD-10-CM

## 2021-09-27 NOTE — Progress Notes (Signed)
[facility]  [address] [place],  [place]  [address] [phone]  Clinic Day:  09/28/2021  Referring physician: Serita Grammes, MD   HISTORY OF PRESENT ILLNESS:  The patient is a 88 y.o. male  who I was asked to consult upon for newly diagnosed colon cancer.  His history dates back approximately 2 months ago when he began having progressive weakness and leg pain.  When evaluated at his primary care office, his hemoglobin came back very low at 6.4.  This led to him being transfused.  He also underwent a colonoscopy for his unexpected anemia.  This study revealed a lesion at his hepatic flexure, which was biopsy-proven to be cancer.  In January 2023, he underwent a right hemicolectomy, which revealed a 3 cm moderately differentiated adenocarcinoma extending through his colonic wall and into his pericolic tissue.  16 lymph nodes were removed, for which 2 contained cancer.  He comes in today to go over his surgical pathology and its implications.  The patient claims he last had a colonoscopy at 88 years old.  As it was normal at that time, it was not recommended additional colonoscopies be done.  He denies ever having any overt forms of GI blood loss recently which alerted him to colon cancer being present.  PAST MEDICAL HISTORY:   Past Medical History:  Diagnosis Date   Abnormal weight loss    B12 deficiency    daughter states this has resolved   Benign essential hypertension    Dyspnea on exertion    Patient denies dyspnea on exertion at present- states this only occured when his HGB was low   HLD (hyperlipidemia)    Osteoporosis    Paroxysmal atrial fibrillation (HCC)     PAST SURGICAL HISTORY:  Right hemicolectomy  CURRENT MEDICATIONS:   Current Outpatient Medications  Medication Sig Dispense Refill   ferrous sulfate 325 (65 FE) MG tablet Take 1 tablet by mouth daily with breakfast.     losartan-hydrochlorothiazide (HYZAAR) 100-12.5 MG per  tablet Take 1 tablet by mouth daily.       omeprazole (PRILOSEC) 20 MG capsule Take 1 capsule by mouth daily.     No current facility-administered medications for this visit.    ALLERGIES:   Allergies  Allergen Reactions   Aspirin     FAMILY HISTORY:   Family History  Problem Relation Age of Onset   Stomach cancer Father        died at age 70   Colon cancer Sister    Esophageal cancer Neg Hx    Rectal cancer Neg Hx   1 brother had colon cancer in his 35s.  SOCIAL HISTORY:  The patient was born and raised in [place].  [place].  He has been widowed twice.  He was a Furniture conservator/restorer for 34 years.  There is no history of alcoholism or tobacco abuse.  REVIEW OF SYSTEMS:  Review of Systems  Constitutional:  Negative for fatigue, fever and unexpected weight change.  HENT:   Positive for hearing loss.   Respiratory:  Negative for chest tightness, cough, hemoptysis and shortness of breath.   Cardiovascular:  Negative for chest pain and palpitations.  Gastrointestinal:  Negative for abdominal distention, abdominal pain, blood in stool, constipation, diarrhea, nausea and vomiting.  Genitourinary:  Negative for dysuria, frequency and hematuria.   Musculoskeletal:  Negative for arthralgias, back pain and myalgias.  Skin:  Negative for itching and rash.  Neurological:  Negative for dizziness, headaches and light-headedness.  Psychiatric/Behavioral:  Negative for depression and suicidal ideas. The patient is not nervous/anxious.     PHYSICAL EXAM:  Blood pressure (!) 151/69, pulse 74, temperature 98.4 F (36.9 C), temperature source Oral, resp. rate 18, height 5\' 7"  (1.702 m), weight 161 lb 6.4 oz (73.2 kg), SpO2 100 %. Wt Readings from Last 3 Encounters:  09/28/21 161 lb 6.4 oz (73.2 kg)  08/03/21 167 lb (75.8 kg)  07/11/21 167 lb 12.8 oz (76.1 kg)   Body mass index is 25.28 kg/m. Performance status (ECOG): 1 - Symptomatic but completely  ambulatory Physical Exam Constitutional:      Appearance: Normal appearance. He is not ill-appearing.  HENT:     Mouth/Throat:     Mouth: Mucous membranes are moist.     Pharynx: Oropharynx is clear. No oropharyngeal exudate or posterior oropharyngeal erythema.  Cardiovascular:     Rate and Rhythm: Normal rate and regular rhythm.     Heart sounds: No murmur heard.   No friction rub. No gallop.  Pulmonary:     Effort: Pulmonary effort is normal. No respiratory distress.     Breath sounds: Normal breath sounds. No wheezing, rhonchi or rales.  Abdominal:     General: Bowel sounds are normal. There is no distension.     Palpations: Abdomen is soft. There is no mass.     Tenderness: There is no abdominal tenderness.  Musculoskeletal:        General: No swelling.     Right lower leg: No edema.     Left lower leg: No edema.  Lymphadenopathy:     Cervical: No cervical adenopathy.     Upper Body:     Right upper body: No supraclavicular or axillary adenopathy.     Left upper body: No supraclavicular or axillary adenopathy.     Lower Body: No right inguinal adenopathy. No left inguinal adenopathy.  Skin:    General: Skin is warm.     Coloration: Skin is not jaundiced.     Findings: No lesion or rash.  Neurological:     General: No focal deficit present.     Mental Status: He is alert and oriented to person, place, and time. Mental status is at baseline.  Psychiatric:        Mood and Affect: Mood normal.        Behavior: Behavior normal.        Thought Content: Thought content normal.   LABS:   CBC Latest Ref Rng & Units 09/28/2021 07/11/2021 06/12/2021  WBC - 7.4 7.5 9.2  Hemoglobin 13.5 - 17.5 11.7(A) 12.0(L) 9.0(L)  Hematocrit 41 - 53 36(A) 39.7 30.0(L)  Platelets 150 - 399 282 294 382.0   CMP Latest Ref Rng & Units 09/28/2021 08/03/2021 07/11/2021  Glucose 70 - 99 mg/dL - 80 121(H)  BUN 4 - 21 15 16 14   Creatinine 0.6 - 1.3 1.0 1.10 0.95  Sodium 137 - 147 140 137 141   Potassium 3.4 - 5.3 3.7 3.5 3.9  Chloride 99 - 108 107 101 104  CO2 13 - 22 25(A) 23 23  Calcium 8.7 - 10.7 9.1 9.3 9.6  Total Protein 6.0 - 8.3 g/dL - 7.2 -  Total Bilirubin 0.2 - 1.2 mg/dL - 0.4 -  Alkaline Phos 25 - 125 78 67 -  AST 14 - 40 21 21 -  ALT 10 - 40 17 15 -   CEA  Date Value Ref Range Status  09/28/2021 4.6 0.0 - 4.7 ng/mL Final    Comment:    (NOTE)                             Nonsmokers          <3.9                             Smokers             <5.6 Roche [employer] Electrochemiluminescence Immunoassay (ECLIA) Values obtained with different assay methods or kits cannot be used interchangeably.  Results cannot be interpreted as absolute evidence of the presence or absence of malignant disease. Performed At: [facility] [place], [place] [id number] [facility] MD HY:[id number]    ASSESSMENT & PLAN:  A 88 y.o. male who I was asked to consult upon for stage IIIB colon cancer, status post a right hemicolectomy in January 2023.  In clinic today, I explained to the patient and his family that stage III colon cancer is usually treated with adjuvant chemotherapy.  In someone beyond 86 years old, 1 potential option would be single-agent Xeloda.  For someone who is over 32 years old, recommending any form of adjuvant chemotherapy may not be prudent, especially when considering the overall life expectancy one has at this point in life, irrespective of their underlying colon cancer diagnosis.  I did explain to the patient that Xeloda can be associated with such side effects as diarrhea, mouth sores, and hand-foot syndrome.  At this point in his life, I do believe it would only lead to a modest reduction in disease recurrence.  My overall concern is that this gentleman's mortality may ultimately come from another etiology outside of his recently diagnosed colon cancer.  It appears he is leaning towards not considering any form of adjuvant chemotherapy.   However, he wishes to discuss this with his family over the weekend.  Moving forward, his surveillance will consist of him undergoing routine physical exams and CEA level assessments every 4 months.  I would also consider repeating another scan in about a year for his radiographic colon cancer surveillance.  Clinically, he appears to be doing well.  I will see him back in 4 months for repeat clinical assessment.  The patient and his family understand all the plans discussed today and are in agreement with them.  I do appreciate Serita Grammes, MD for his new consult.   Tamarah Bhullar Macarthur Critchley, MD

## 2021-09-28 ENCOUNTER — Inpatient Hospital Stay: Payer: Medicare Other | Attending: Oncology | Admitting: Oncology

## 2021-09-28 ENCOUNTER — Other Ambulatory Visit: Payer: Self-pay | Admitting: Oncology

## 2021-09-28 ENCOUNTER — Encounter: Payer: Self-pay | Admitting: Oncology

## 2021-09-28 ENCOUNTER — Inpatient Hospital Stay: Payer: Medicare Other

## 2021-09-28 ENCOUNTER — Other Ambulatory Visit: Payer: Self-pay

## 2021-09-28 DIAGNOSIS — E538 Deficiency of other specified B group vitamins: Secondary | ICD-10-CM

## 2021-09-28 DIAGNOSIS — C189 Malignant neoplasm of colon, unspecified: Secondary | ICD-10-CM | POA: Insufficient documentation

## 2021-09-28 DIAGNOSIS — M810 Age-related osteoporosis without current pathological fracture: Secondary | ICD-10-CM

## 2021-09-28 DIAGNOSIS — Z79899 Other long term (current) drug therapy: Secondary | ICD-10-CM | POA: Diagnosis not present

## 2021-09-28 DIAGNOSIS — I480 Paroxysmal atrial fibrillation: Secondary | ICD-10-CM | POA: Diagnosis not present

## 2021-09-28 DIAGNOSIS — C183 Malignant neoplasm of hepatic flexure: Secondary | ICD-10-CM | POA: Diagnosis not present

## 2021-09-28 DIAGNOSIS — Z800 Family history of malignant neoplasm of digestive organs: Secondary | ICD-10-CM | POA: Diagnosis not present

## 2021-09-28 DIAGNOSIS — D649 Anemia, unspecified: Secondary | ICD-10-CM | POA: Diagnosis not present

## 2021-09-28 DIAGNOSIS — I10 Essential (primary) hypertension: Secondary | ICD-10-CM

## 2021-09-28 LAB — BASIC METABOLIC PANEL
BUN: 15 (ref 4–21)
CO2: 25 — AB (ref 13–22)
Chloride: 107 (ref 99–108)
Creatinine: 1 (ref 0.6–1.3)
Glucose: 95
Potassium: 3.7 (ref 3.4–5.3)
Sodium: 140 (ref 137–147)

## 2021-09-28 LAB — HEPATIC FUNCTION PANEL
ALT: 17 (ref 10–40)
AST: 21 (ref 14–40)
Alkaline Phosphatase: 78 (ref 25–125)
Bilirubin, Total: 0.4

## 2021-09-28 LAB — COMPREHENSIVE METABOLIC PANEL
Albumin: 3.7 (ref 3.5–5.0)
Calcium: 9.1 (ref 8.7–10.7)

## 2021-09-28 LAB — CBC AND DIFFERENTIAL
HCT: 36 — AB (ref 41–53)
Hemoglobin: 11.7 — AB (ref 13.5–17.5)
Neutrophils Absolute: 5.33
Platelets: 282 (ref 150–399)
WBC: 7.4

## 2021-09-28 LAB — CBC: RBC: 4.59 (ref 3.87–5.11)

## 2021-09-29 ENCOUNTER — Telehealth: Payer: Self-pay

## 2021-09-29 ENCOUNTER — Telehealth: Payer: Self-pay | Admitting: Oncology

## 2021-09-29 LAB — CEA: CEA: 4.6 ng/mL (ref 0.0–4.7)

## 2021-09-29 NOTE — Telephone Encounter (Signed)
Spoke with patients daughter to scheduled 61m follow-up. At this time she refused to schedule but will call our office on Monday to see what their final decision is on what they are planning to do.

## 2021-09-29 NOTE — Telephone Encounter (Signed)
Per Dr. Bobby Rumpf.  He is underwhelmed by CEA of 4.6, the different methods do not matter to him.  Prognosis about 50% survival for 5 years with chemo and 35% survival without.  I relayed this information back to daughter and spouse.  She was thankful for the response.

## 2021-10-11 ENCOUNTER — Telehealth: Payer: Self-pay

## 2021-10-11 NOTE — Telephone Encounter (Addendum)
10/12/21 Dr Bobby Rumpf sent Xeloda eRx to [facility]. ? ?10/11/21 - Pt's daughter called to make [place] aware that her dad has decided to try the medication. I notified Dr Bobby Rumpf of above. He will prescribe Xeloda. Pt's daughter notified, as she will have to come with him to appointments because her dad doesn't hear well. I told her they would have an chemo education session with pharmacist before starting the medication.  ?

## 2021-10-12 ENCOUNTER — Telehealth: Payer: Self-pay

## 2021-10-12 ENCOUNTER — Encounter: Payer: Self-pay | Admitting: Oncology

## 2021-10-12 ENCOUNTER — Other Ambulatory Visit (HOSPITAL_COMMUNITY): Payer: Self-pay

## 2021-10-12 ENCOUNTER — Other Ambulatory Visit: Payer: Self-pay | Admitting: Oncology

## 2021-10-12 ENCOUNTER — Telehealth: Payer: Self-pay | Admitting: Pharmacy Technician

## 2021-10-12 MED ORDER — CAPECITABINE 150 MG PO TABS
300.0000 mg | ORAL_TABLET | Freq: Two times a day (BID) | ORAL | 7 refills | Status: DC
  Filled 2021-10-12: qty 56, 14d supply, fill #0

## 2021-10-12 MED ORDER — CAPECITABINE 500 MG PO TABS
1500.0000 mg | ORAL_TABLET | Freq: Two times a day (BID) | ORAL | 7 refills | Status: DC
  Filled 2021-10-12 – 2021-10-13 (×2): qty 84, 14d supply, fill #0
  Filled 2021-10-23: qty 84, 14d supply, fill #1
  Filled 2021-11-06: qty 84, 14d supply, fill #2
  Filled 2021-11-27: qty 84, 14d supply, fill #3

## 2021-10-12 NOTE — Telephone Encounter (Signed)
Oral Oncology Pharmacist Encounter ? ?Received new prescription for capecitabine (Xeloda)  for the treatment of colorectal cancer in the adjuvant setting, planned duration for 8 cycles or until disease progression or unacceptable toxicity. Spoke to MD regarding dosing of xeloda, MD is okay with decreasing dose from 1800mg  BID (500mg  tabs and 150mg  tabs) to 1500mg  BID. Will discontinue 150mg  prescription per MD discussion.  ? ?Labs from 09/28/21 assessed, patients CrCl is slightly below 50 at 48.8 Dose already adjusted as seen above. ? ?Current medication list in Epic reviewed, DDIs with xeloda identified: ?-omeprazole: PPIs can decrease the efficacy of xeloda. Will discuss with patient. ? ?Evaluated chart and no patient barriers to medication adherence noted.  ? ?Patient agreement for treatment documented in MD note on 09/28/21. ? ?Prescription has been e-scribed to the [facility] for benefits analysis and approval. ? ?Oral Oncology Clinic will continue to follow for insurance authorization, copayment issues, initial counseling and start date. ? ?Drema Halon, PharmD ?Hematology/Oncology Clinical Pharmacist ?[facility] ?[phone] ?10/12/2021 3:27 PM ? ?

## 2021-10-12 NOTE — Progress Notes (Signed)
START ON PATHWAY REGIMEN - Colorectal ? ? ?  A cycle is every 21 days: ?    Capecitabine  ? ?**Always confirm dose/schedule in your pharmacy ordering system** ? ?Patient Characteristics: ?Postoperative without Neoadjuvant Therapy (Pathologic Staging), Colon, Stage III, Low Risk (pT1-3, pN1) ?Tumor Location: Colon ?Therapeutic Status: Postoperative without Neoadjuvant Therapy (Pathologic Staging) ?AJCC M Category: cM0 ?AJCC T Category: pT3 ?AJCC N Category: pN1b ?AJCC 8 Stage Grouping: IIIB ?Intent of Therapy: ?Curative Intent, Discussed with Patient ?

## 2021-10-12 NOTE — Telephone Encounter (Signed)
Received New start notification for  Capecitabine . Will update as we work through the benefits process. ? ?Insurance: Medicare B ? ?No Pa is required. ? ?150mg  rx- copay is $11.49 for 1 cycle ? ?500mg  rx- copay is $28.22 for 1 cycle ? ?

## 2021-10-13 ENCOUNTER — Other Ambulatory Visit (HOSPITAL_COMMUNITY): Payer: Self-pay

## 2021-10-13 NOTE — Telephone Encounter (Signed)
Spoke to daughter Suanne Marker and setup just shipment for 3/6- Should deliver no later than 3/8 ?

## 2021-10-13 NOTE — Telephone Encounter (Addendum)
Oral Chemotherapy Pharmacist Encounter ? ?I spoke with patient and his daughter for overview of: Xeloda (capecitabine) for the  treatment of colorectal cancer, planned duration until disease progression or unacceptable drug toxicity. ? ?Counseled patient on administration, dosing, side effects, monitoring, drug-food interactions, safe handling, storage, and disposal. ? ?Patient will take Xeloda 500mg  tablets, 3 tablets (1500mg ) by mouth in AM and 3 tabs (1500mg ) by mouth in PM, within 30 minutes of finishing meals, for 14 days on, 7 days off, repeated every 21 days. ? ?Xeloda start date: 10/18/2021 ? ?Adverse effects include but are not limited to: fatigue, decreased blood counts, GI upset, diarrhea, mouth sores, and hand-foot syndrome. ? ?Patient has anti-emetic on hand and knows to take it if nausea develops.   ?Patient will obtain anti diarrheal and alert the office of 4 or more loose stools above baseline. ? ?Patient stated he is okay with not taking the omeprazole anymore and he will take famotidine if he gets acid reflux.  ? ?Reviewed with patient importance of keeping a medication schedule and plan for any missed doses. No barriers to medication adherence identified. ? ?Medication reconciliation performed and medication/allergy list updated. ? ?This will ship from the [facility] on 10/16/2021 to deliver to patient's home on 10/17/2021. ? ?Patient informed the pharmacy will reach out 5-7 days prior to needing next fill of Xeloda to coordinate continued medication acquisition to prevent break in therapy. ? ?All questions answered. ? ?Mr. Pitre and his daughter voiced understanding and appreciation.  ? ?Medication education handout placed in mail for patient. Patient knows to call the office with questions or concerns. Oral Chemotherapy Clinic phone number provided to patient.  ? ?Drema Halon, PharmD ?Hematology/Oncology Clinical Pharmacist ?[facility] ?[phone] ?

## 2021-10-16 ENCOUNTER — Encounter: Payer: Self-pay | Admitting: Oncology

## 2021-10-16 ENCOUNTER — Other Ambulatory Visit (HOSPITAL_COMMUNITY): Payer: Self-pay

## 2021-10-17 ENCOUNTER — Other Ambulatory Visit: Payer: Self-pay | Admitting: Hematology and Oncology

## 2021-10-17 DIAGNOSIS — C183 Malignant neoplasm of hepatic flexure: Secondary | ICD-10-CM

## 2021-10-20 ENCOUNTER — Other Ambulatory Visit (HOSPITAL_COMMUNITY): Payer: Self-pay

## 2021-10-23 ENCOUNTER — Other Ambulatory Visit (HOSPITAL_COMMUNITY): Payer: Self-pay

## 2021-10-24 ENCOUNTER — Telehealth: Payer: Self-pay

## 2021-10-24 NOTE — Telephone Encounter (Signed)
I spoke with Suanne Marker, pt's daughter. She states "He is doing fine. He is still walking 1.5 miles per day". He did tell her he noticed being a little tired. Denies other side effects. He is taking the Xeloda '500mg'$  3 tabs po after breakfast and after supper. No missed doses. He is using Udderly Soft lotion/cream on his hands and feet TID per daughter. I reminded her to call us if he develops temp of 100.4 or higher, day or night. She verbalized understanding. We also confirmed next appt. ?

## 2021-10-26 ENCOUNTER — Other Ambulatory Visit (HOSPITAL_COMMUNITY): Payer: Self-pay

## 2021-10-30 ENCOUNTER — Inpatient Hospital Stay: Payer: Medicare Other | Attending: Oncology | Admitting: Hematology and Oncology

## 2021-10-30 ENCOUNTER — Other Ambulatory Visit: Payer: Self-pay

## 2021-10-30 ENCOUNTER — Encounter: Payer: Self-pay | Admitting: Hematology and Oncology

## 2021-10-30 ENCOUNTER — Inpatient Hospital Stay: Payer: Medicare Other

## 2021-10-30 DIAGNOSIS — C182 Malignant neoplasm of ascending colon: Secondary | ICD-10-CM | POA: Diagnosis not present

## 2021-10-30 DIAGNOSIS — C183 Malignant neoplasm of hepatic flexure: Secondary | ICD-10-CM

## 2021-10-30 DIAGNOSIS — C189 Malignant neoplasm of colon, unspecified: Secondary | ICD-10-CM | POA: Diagnosis not present

## 2021-10-30 LAB — BASIC METABOLIC PANEL
BUN: 18 (ref 4–21)
CO2: 27 — AB (ref 13–22)
Chloride: 105 (ref 99–108)
Creatinine: 0.9 (ref 0.6–1.3)
Glucose: 103
Potassium: 3.7 mEq/L (ref 3.5–5.1)
Sodium: 137 (ref 137–147)

## 2021-10-30 LAB — HEPATIC FUNCTION PANEL
ALT: 16 U/L (ref 10–40)
AST: 20 (ref 14–40)
Alkaline Phosphatase: 64 (ref 25–125)
Bilirubin, Total: 0.6

## 2021-10-30 LAB — CBC AND DIFFERENTIAL
HCT: 38 — AB (ref 41–53)
Hemoglobin: 11.9 — AB (ref 13.5–17.5)
Neutrophils Absolute: 4.9
Platelets: 295 10*3/uL (ref 150–400)
WBC: 6.9

## 2021-10-30 LAB — COMPREHENSIVE METABOLIC PANEL
Albumin: 3.7 (ref 3.5–5.0)
Calcium: 9.4 (ref 8.7–10.7)

## 2021-10-30 LAB — CBC: RBC: 4.66 (ref 3.87–5.11)

## 2021-10-30 NOTE — Progress Notes (Cosign Needed)
?Patient Care Team: ?Serita Grammes, MD as PCP - General (Family Medicine) ?Wellington Hampshire, MD as PCP - Cardiology (Cardiology) ?Jackquline Denmark, MD as Consulting Physician (Gastroenterology) ?Marice Potter, MD as Consulting Physician (Oncology) ? ?Clinic Day:  10/30/2021 ? ?Referring physician: Serita Grammes, MD ? ?ASSESSMENT & PLAN:  ? ?Assessment & Plan: ?Colon cancer ([place]) ?A 88 y.o. male with stage IIIB colon cancer, status post a right hemicolectomy in January 2023. He was started on capecitabine and is tolerating his first cycle well with minimal side effects. He has noticed some decrease in energy; however, he is used to walking 2-5 miles per day. He will return to clinic with scheduled follow up with Dr. Bobby Rumpf. ?  ? ?The patient understands the plans discussed today and is in agreement with them.  He knows to contact our office if he develops concerns prior to his next appointment. ? ? ? ?Melodye Ped, NP  ?[facility] ?[place] ?[place] [address] ?Dept: [phone] ?Dept Fax: [phone]  ? ?No orders of the defined types were placed in this encounter. ?  ? ? ?CHIEF COMPLAINT:  ?CC: A 88 year old male with history of colon cancer here for 2 week follow up since starting capecitabine ? ?Current Treatment:  Capecitabine ? ?INTERVAL HISTORY:  ?Linnie is here today for repeat clinical assessment. He denies fevers or chills. He denies pain. His appetite is good. His weight has been stable. ? ?I have reviewed the past medical history, past surgical history, social history and family history with the patient and they are unchanged from previous note. ? ?ALLERGIES:  is allergic to aspirin. ? ?MEDICATIONS:  ?Current Outpatient Medications  ?Medication Sig Dispense Refill  ? capecitabine (XELODA) 500 MG tablet Take 3 tablets (1,500 mg total) by mouth 2 (two) times daily after a meal. 84 tablet 7  ? ferrous sulfate 325 (65 FE)  MG tablet Take 1 tablet by mouth every other day.    ? losartan-hydrochlorothiazide (HYZAAR) 100-12.5 MG per tablet Take 1 tablet by mouth daily.      ? Multiple Vitamins-Minerals (ICAPS AREDS 2 PO) Take 1 capsule by mouth daily.    ? omeprazole (PRILOSEC) 20 MG capsule Take 1 capsule by mouth daily.    ? ?No current facility-administered medications for this visit.  ? ? ?HISTORY OF PRESENT ILLNESS:  ? ?Oncology History  ?Colon cancer ([place])  ?09/28/2021 Initial Diagnosis  ? Colon cancer [facility]) ?  ?10/16/2021 -  Chemotherapy  ? Patient is on Treatment Plan : COLORECTAL Capecitabine q21d  ?   ?  ? ? ?REVIEW OF SYSTEMS:  ? ?Constitutional: Denies fevers, chills or abnormal weight loss ?Eyes: Denies blurriness of vision ?Ears, nose, mouth, throat, and face: Denies mucositis or sore throat ?Respiratory: Denies cough, dyspnea or wheezes ?Cardiovascular: Denies palpitation, chest discomfort or lower extremity swelling ?Gastrointestinal:  Denies nausea, heartburn or change in bowel habits ?Skin: Denies abnormal skin rashes ?Lymphatics: Denies new lymphadenopathy or easy bruising ?Neurological:Denies numbness, tingling or new weaknesses ?Behavioral/Psych: Mood is stable, no new changes  ?All other systems were reviewed with the patient and are negative. ? ? ?VITALS:  ?Blood pressure (!) 160/69, pulse 66, temperature 98.2 ?F (36.8 ?C), temperature source Oral, resp. rate 20, height '5\' 5"'$  (1.651 m), weight 163 lb 11.2 oz (74.3 kg), SpO2 99 %.  ?Wt Readings from Last 3 Encounters:  ?10/30/21 163 lb 11.2 oz (74.3 kg)  ?09/28/21 161 lb 6.4 oz (  73.2 kg)  ?08/03/21 167 lb (75.8 kg)  ?  ?Body mass index is 27.24 kg/m?. ? ?Performance status (ECOG): 1 - Symptomatic but completely ambulatory ? ?PHYSICAL EXAM:  ? ?GENERAL:alert, no distress and comfortable ?SKIN: skin color, texture, turgor are normal, no rashes or significant lesions ?EYES: normal, Conjunctiva are pink and non-injected, sclera clear ?OROPHARYNX:no exudate, no erythema and  lips, buccal mucosa, and tongue normal  ?NECK: supple, thyroid normal size, non-tender, without nodularity ?LYMPH:  no palpable lymphadenopathy in the cervical, axillary or inguinal ?LUNGS: clear to auscultation and percussion with normal breathing effort ?HEART: regular rate & rhythm and no murmurs and no lower extremity edema ?ABDOMEN:abdomen soft, non-tender and normal bowel sounds ?Musculoskeletal:no cyanosis of digits and no clubbing  ?NEURO: alert & oriented x 3 with fluent speech, no focal motor/sensory deficits ? ?LABORATORY DATA:  ?I have reviewed the data as listed ?   ?Component Value Date/Time  ? NA 137 10/30/2021 0000  ? K 3.7 10/30/2021 0000  ? CL 105 10/30/2021 0000  ? CO2 27 (A) 10/30/2021 0000  ? GLUCOSE 80 08/03/2021 1705  ? BUN 18 10/30/2021 0000  ? CREATININE 0.9 10/30/2021 0000  ? CREATININE 1.10 08/03/2021 1705  ? CALCIUM 9.4 10/30/2021 0000  ? PROT 7.2 08/03/2021 1705  ? ALBUMIN 3.7 10/30/2021 0000  ? AST 20 10/30/2021 0000  ? ALT 16 10/30/2021 0000  ? ALKPHOS 64 10/30/2021 0000  ? BILITOT 0.4 08/03/2021 1705  ? ? ?No results found for: SPEP, UPEP ? ?Lab Results  ?Component Value Date  ? WBC 6.9 10/30/2021  ? NEUTROABS 4.90 10/30/2021  ? HGB 11.9 (A) 10/30/2021  ? HCT 38 (A) 10/30/2021  ? MCV 80 07/11/2021  ? PLT 295 10/30/2021  ? ? ?  Chemistry   ?   ?Component Value Date/Time  ? NA 137 10/30/2021 0000  ? K 3.7 10/30/2021 0000  ? CL 105 10/30/2021 0000  ? CO2 27 (A) 10/30/2021 0000  ? BUN 18 10/30/2021 0000  ? CREATININE 0.9 10/30/2021 0000  ? CREATININE 1.10 08/03/2021 1705  ? GLU 103 10/30/2021 0000  ?    ?Component Value Date/Time  ? CALCIUM 9.4 10/30/2021 0000  ? ALKPHOS 64 10/30/2021 0000  ? AST 20 10/30/2021 0000  ? ALT 16 10/30/2021 0000  ? BILITOT 0.4 08/03/2021 1705  ?  ? ? ? ?RADIOGRAPHIC STUDIES: ?I have personally reviewed the radiological images as listed and agreed with the findings in the report. ?No results found. ?

## 2021-10-30 NOTE — Assessment & Plan Note (Signed)
A 88 y.o. male with stage IIIB colon cancer, status post a right hemicolectomy in January 2023. He was started on capecitabine and is tolerating his first cycle well with minimal side effects. He has noticed some decrease in energy; however, he is used to walking 2-5 miles per day. He will return to clinic with scheduled follow up with Dr. Bobby Rumpf. ?

## 2021-11-02 ENCOUNTER — Other Ambulatory Visit (HOSPITAL_COMMUNITY): Payer: Self-pay

## 2021-11-06 ENCOUNTER — Other Ambulatory Visit (HOSPITAL_COMMUNITY): Payer: Self-pay

## 2021-11-07 ENCOUNTER — Other Ambulatory Visit: Payer: Self-pay | Admitting: Oncology

## 2021-11-07 NOTE — Progress Notes (Incomplete)
?[facility]  ?[address] ?[place],  [place]  [address] ?[phone] ? ?Clinic Day:  09/28/2021 ? ?Referring physician: No ref. provider found ? ? ?HISTORY OF PRESENT ILLNESS:  ?The patient is a 88 y.o. male  who I was asked to consult upon for newly diagnosed colon cancer.  His history dates back approximately 2 months ago when he began having progressive weakness and leg pain.  When evaluated at his primary care office, his hemoglobin came back very low at 6.4.  This led to him being transfused.  He also underwent a colonoscopy for his unexpected anemia.  This study revealed a lesion at his hepatic flexure, which was biopsy-proven to be cancer.  In January 2023, he underwent a right hemicolectomy, which revealed a 3 cm moderately differentiated adenocarcinoma extending through his colonic wall and into his pericolic tissue.  16 lymph nodes were removed, for which 2 contained cancer.  He comes in today to go over his surgical pathology and its implications.  The patient claims he last had a colonoscopy at 88 years old.  As it was normal at that time, it was not recommended additional colonoscopies be done.  He denies ever having any overt forms of GI blood loss recently which alerted him to colon cancer being present. ? ?PAST MEDICAL HISTORY:  ? ?Past Medical History:  ?Diagnosis Date  ?? Abnormal weight loss   ?? B12 deficiency   ? daughter states this has resolved  ?? Benign essential hypertension   ?? Dyspnea on exertion   ? Patient denies dyspnea on exertion at present- states this only occured when his HGB was low  ?? HLD (hyperlipidemia)   ?? Osteoporosis   ?? Paroxysmal atrial fibrillation (HCC)   ? ? ?PAST SURGICAL HISTORY:  ?Right hemicolectomy ? ?CURRENT MEDICATIONS:  ? ?Current Outpatient Medications  ?Medication Sig Dispense Refill  ?? capecitabine (XELODA) 500 MG tablet Take 3 tablets (1,500 mg total) by mouth 2 (two) times daily after a meal. 84 tablet 7  ??  ferrous sulfate 325 (65 FE) MG tablet Take 1 tablet by mouth every other day.    ?? losartan-hydrochlorothiazide (HYZAAR) 100-12.5 MG per tablet Take 1 tablet by mouth daily.      ?? Multiple Vitamins-Minerals (ICAPS AREDS 2 PO) Take 1 capsule by mouth daily.    ?? omeprazole (PRILOSEC) 20 MG capsule Take 1 capsule by mouth daily.    ? ?No current facility-administered medications for this visit.  ? ? ?ALLERGIES:  ? ?Allergies  ?Allergen Reactions  ?? Aspirin   ? ? ?FAMILY HISTORY:  ? ?Family History  ?Problem Relation Age of Onset  ?? Stomach cancer Father   ?     died at age 34  ?? Colon cancer Sister   ?? Esophageal cancer Neg Hx   ?? Rectal cancer Neg Hx   ?1 brother had colon cancer in his 87s. ? ?SOCIAL HISTORY:  ?The patient was born and raised in [place].  [place].  He has been widowed twice.  He was a Furniture conservator/restorer for 34 years.  There is no history of alcoholism or tobacco abuse. ? ?REVIEW OF SYSTEMS:  ?Review of Systems  ?Constitutional:  Negative for fatigue, fever and unexpected weight change.  ?HENT:   Positive for hearing loss.   ?Respiratory:  Negative for chest tightness, cough, hemoptysis and shortness of breath.   ?Cardiovascular:  Negative for chest pain and palpitations.  ?Gastrointestinal:  Negative for  abdominal distention, abdominal pain, blood in stool, constipation, diarrhea, nausea and vomiting.  ?Genitourinary:  Negative for dysuria, frequency and hematuria.   ?Musculoskeletal:  Negative for arthralgias, back pain and myalgias.  ?Skin:  Negative for itching and rash.  ?Neurological:  Negative for dizziness, headaches and light-headedness.  ?Psychiatric/Behavioral:  Negative for depression and suicidal ideas. The patient is not nervous/anxious.    ? ?PHYSICAL EXAM:  ?There were no vitals taken for this visit. ?Wt Readings from Last 3 Encounters:  ?10/30/21 163 lb 11.2 oz (74.3 kg)  ?09/28/21 161 lb 6.4 oz (73.2 kg)  ?08/03/21 167 lb (75.8 kg)  ? ?There is no  height or weight on file to calculate BMI. ?Performance status (ECOG): 1 - Symptomatic but completely ambulatory ?Physical Exam ?Constitutional:   ?   Appearance: Normal appearance. He is not ill-appearing.  ?HENT:  ?   Mouth/Throat:  ?   Mouth: Mucous membranes are moist.  ?   Pharynx: Oropharynx is clear. No oropharyngeal exudate or posterior oropharyngeal erythema.  ?Cardiovascular:  ?   Rate and Rhythm: Normal rate and regular rhythm.  ?   Heart sounds: No murmur heard. ?  No friction rub. No gallop.  ?Pulmonary:  ?   Effort: Pulmonary effort is normal. No respiratory distress.  ?   Breath sounds: Normal breath sounds. No wheezing, rhonchi or rales.  ?Abdominal:  ?   General: Bowel sounds are normal. There is no distension.  ?   Palpations: Abdomen is soft. There is no mass.  ?   Tenderness: There is no abdominal tenderness.  ?Musculoskeletal:     ?   General: No swelling.  ?   Right lower leg: No edema.  ?   Left lower leg: No edema.  ?Lymphadenopathy:  ?   Cervical: No cervical adenopathy.  ?   Upper Body:  ?   Right upper body: No supraclavicular or axillary adenopathy.  ?   Left upper body: No supraclavicular or axillary adenopathy.  ?   Lower Body: No right inguinal adenopathy. No left inguinal adenopathy.  ?Skin: ?   General: Skin is warm.  ?   Coloration: Skin is not jaundiced.  ?   Findings: No lesion or rash.  ?Neurological:  ?   General: No focal deficit present.  ?   Mental Status: He is alert and oriented to person, place, and time. Mental status is at baseline.  ?Psychiatric:     ?   Mood and Affect: Mood normal.     ?   Behavior: Behavior normal.     ?   Thought Content: Thought content normal.  ? ?LABS:  ? ? ?  Latest Ref Rng & Units 10/30/2021  ? 12:00 AM 09/28/2021  ? 12:00 AM 07/11/2021  ?  2:57 PM  ?CBC  ?WBC  6.9      7.4   7.5    ?Hemoglobin 13.5 - 17.5 11.9      11.7   12.0    ?Hematocrit 41 - 53 38      36   39.7    ?Platelets 150 - 400 K/uL 295      282   294    ?  ? This result is from an  external source.  ? ? ? ?  Latest Ref Rng & Units 10/30/2021  ? 12:00 AM 09/28/2021  ? 12:00 AM 08/03/2021  ?  5:05 PM  ?CMP  ?Glucose 70 - 99 mg/dL   80    ?  BUN 4 - '21 18      15   16    '$ ?Creatinine 0.6 - 1.3 0.9      1.0   1.10    ?Sodium 137 - 147 137      140   137    ?Potassium 3.5 - 5.1 mEq/L 3.7      3.7   3.5    ?Chloride 99 - 108 105      107   101    ?CO2 13 - '22 27      25   23    '$ ?Calcium 8.7 - 10.7 9.4      9.1   9.3    ?Total Protein 6.0 - 8.3 g/dL   7.2    ?Total Bilirubin 0.2 - 1.2 mg/dL   0.4    ?Alkaline Phos 25 - 125 64      78   67    ?AST 14 - 40 '20      21   21    '$ ?ALT 10 - 40 U/L '16      17   15    '$ ?  ? This result is from an external source.  ? ? ?CEA  ?Date Value Ref Range Status  ?09/28/2021 4.6 0.0 - 4.7 ng/mL Final  ?  Comment:  ?  (NOTE) ?                            Nonsmokers          <3.9 ?                            Smokers             <5.6 ?Roche Diagnostics Electrochemiluminescence Immunoassay ?(ECLIA) ?Values obtained with different assay methods or kits ?cannot be used interchangeably.  Results cannot be ?interpreted as absolute evidence of the presence or ?absence of malignant disease. ?Performed At: [facility] ?[address] [place], [place] [id number] ?Rush Farmer MD [id number] ?  ? ?ASSESSMENT & PLAN:  ?A 88 y.o. male who I was asked to consult upon for stage IIIB colon cancer, status post a right hemicolectomy in January 2023.  In clinic today, I explained to the patient and his family that stage III colon cancer is usually treated with adjuvant chemotherapy.  In someone beyond 86 years old, 1 potential option would be single-agent Xeloda.  For someone who is over 59 years old, recommending any form of adjuvant chemotherapy may not be prudent, especially when considering the overall life expectancy one has at this point in life, irrespective of their underlying colon cancer diagnosis.  I did explain to the patient that Xeloda can be associated with such side  effects as diarrhea, mouth sores, and hand-foot syndrome.  At this point in his life, I do believe it would only lead to a modest reduction in disease recurrence.  My overall concern is that this gentleman's mor

## 2021-11-07 NOTE — Progress Notes (Deleted)
?Patient Care Team: ?Serita Grammes, MD as PCP - General (Family Medicine) ?Wellington Hampshire, MD as PCP - Cardiology (Cardiology) ?Jackquline Denmark, MD as Consulting Physician (Gastroenterology) ?Marice Potter, MD as Consulting Physician (Oncology) ? ?Clinic Day:  11/07/2021 ? ?Referring physician: Serita Grammes, MD ? ?ASSESSMENT & PLAN:  ? ?Assessment & Plan: ?No problem-specific Assessment & Plan notes found for this encounter. ? ?  ? ?The patient understands the plans discussed today and is in agreement with them.  He knows to contact our office if he develops concerns prior to his next appointment. ? ? ? ?Jorge Betke Macarthur Critchley, MD  ?[facility] ?[address] ?[place] [address] ?Dept: [phone] ?Dept Fax: [phone]  ? ?No orders of the defined types were placed in this encounter. ?  ? ? ?CHIEF COMPLAINT:  ?CC: A 88 year old male with history of colon cancer here for 2 week follow up since starting capecitabine ? ?Current Treatment:  Capecitabine ? ?INTERVAL HISTORY:  ?Jorge Lawrence is here today for repeat clinical assessment. He denies fevers or chills. He denies pain. His appetite is good. His weight has been stable. ? ?I have reviewed the past medical history, past surgical history, social history and family history with the patient and they are unchanged from previous note. ? ?ALLERGIES:  is allergic to aspirin. ? ?MEDICATIONS:  ?Current Outpatient Medications  ?Medication Sig Dispense Refill  ? capecitabine (XELODA) 500 MG tablet Take 3 tablets (1,500 mg total) by mouth 2 (two) times daily after a meal. 84 tablet 7  ? ferrous sulfate 325 (65 FE) MG tablet Take 1 tablet by mouth every other day.    ? losartan-hydrochlorothiazide (HYZAAR) 100-12.5 MG per tablet Take 1 tablet by mouth daily.      ? Multiple Vitamins-Minerals (ICAPS AREDS 2 PO) Take 1 capsule by mouth daily.    ? omeprazole (PRILOSEC) 20 MG capsule Take 1 capsule by mouth  daily.    ? ?No current facility-administered medications for this visit.  ? ? ?HISTORY OF PRESENT ILLNESS:  ? ?Oncology History  ?Colon cancer ([place])  ?09/28/2021 Initial Diagnosis  ? Colon cancer [facility]) ?  ?10/16/2021 -  Chemotherapy  ? Patient is on Treatment Plan : COLORECTAL Capecitabine q21d  ?   ?  ? ? ?REVIEW OF SYSTEMS:  ? ?Constitutional: Denies fevers, chills or abnormal weight loss ?Eyes: Denies blurriness of vision ?Ears, nose, mouth, throat, and face: Denies mucositis or sore throat ?Respiratory: Denies cough, dyspnea or wheezes ?Cardiovascular: Denies palpitation, chest discomfort or lower extremity swelling ?Gastrointestinal:  Denies nausea, heartburn or change in bowel habits ?Skin: Denies abnormal skin rashes ?Lymphatics: Denies new lymphadenopathy or easy bruising ?Neurological:Denies numbness, tingling or new weaknesses ?Behavioral/Psych: Mood is stable, no new changes  ?All other systems were reviewed with the patient and are negative. ? ? ?VITALS:  ?There were no vitals taken for this visit.  ?Wt Readings from Last 3 Encounters:  ?10/30/21 163 lb 11.2 oz (74.3 kg)  ?09/28/21 161 lb 6.4 oz (73.2 kg)  ?08/03/21 167 lb (75.8 kg)  ?  ?There is no height or weight on file to calculate BMI. ? ?Performance status (ECOG): 1 - Symptomatic but completely ambulatory ? ?PHYSICAL EXAM:  ? ?GENERAL:alert, no distress and comfortable ?SKIN: skin color, texture, turgor are normal, no rashes or significant lesions ?EYES: normal, Conjunctiva are pink and non-injected, sclera clear ?OROPHARYNX:no exudate, no erythema and lips, buccal mucosa, and tongue normal  ?NECK: supple, thyroid  normal size, non-tender, without nodularity ?LYMPH:  no palpable lymphadenopathy in the cervical, axillary or inguinal ?LUNGS: clear to auscultation and percussion with normal breathing effort ?HEART: regular rate & rhythm and no murmurs and no lower extremity edema ?ABDOMEN:abdomen soft, non-tender and normal bowel  sounds ?Musculoskeletal:no cyanosis of digits and no clubbing  ?NEURO: alert & oriented x 3 with fluent speech, no focal motor/sensory deficits ? ?LABORATORY DATA:  ?I have reviewed the data as listed ?   ?Component Value Date/Time  ? NA 137 10/30/2021 0000  ? K 3.7 10/30/2021 0000  ? CL 105 10/30/2021 0000  ? CO2 27 (A) 10/30/2021 0000  ? GLUCOSE 80 08/03/2021 1705  ? BUN 18 10/30/2021 0000  ? CREATININE 0.9 10/30/2021 0000  ? CREATININE 1.10 08/03/2021 1705  ? CALCIUM 9.4 10/30/2021 0000  ? PROT 7.2 08/03/2021 1705  ? ALBUMIN 3.7 10/30/2021 0000  ? AST 20 10/30/2021 0000  ? ALT 16 10/30/2021 0000  ? ALKPHOS 64 10/30/2021 0000  ? BILITOT 0.4 08/03/2021 1705  ? ? ?No results found for: SPEP, UPEP ? ?Lab Results  ?Component Value Date  ? WBC 6.9 10/30/2021  ? NEUTROABS 4.90 10/30/2021  ? HGB 11.9 (A) 10/30/2021  ? HCT 38 (A) 10/30/2021  ? MCV 80 07/11/2021  ? PLT 295 10/30/2021  ? ? ?  Chemistry   ?   ?Component Value Date/Time  ? NA 137 10/30/2021 0000  ? K 3.7 10/30/2021 0000  ? CL 105 10/30/2021 0000  ? CO2 27 (A) 10/30/2021 0000  ? BUN 18 10/30/2021 0000  ? CREATININE 0.9 10/30/2021 0000  ? CREATININE 1.10 08/03/2021 1705  ? GLU 103 10/30/2021 0000  ?    ?Component Value Date/Time  ? CALCIUM 9.4 10/30/2021 0000  ? ALKPHOS 64 10/30/2021 0000  ? AST 20 10/30/2021 0000  ? ALT 16 10/30/2021 0000  ? BILITOT 0.4 08/03/2021 1705  ?  ? ? ? ?RADIOGRAPHIC STUDIES: ?I have personally reviewed the radiological images as listed and agreed with the findings in the report. ?No results found. ?

## 2021-11-08 ENCOUNTER — Inpatient Hospital Stay: Payer: Medicare Other

## 2021-11-08 ENCOUNTER — Inpatient Hospital Stay (INDEPENDENT_AMBULATORY_CARE_PROVIDER_SITE_OTHER): Payer: Medicare Other | Admitting: Oncology

## 2021-11-08 ENCOUNTER — Other Ambulatory Visit: Payer: Self-pay

## 2021-11-08 ENCOUNTER — Telehealth: Payer: Self-pay

## 2021-11-08 ENCOUNTER — Telehealth: Payer: Self-pay | Admitting: Oncology

## 2021-11-08 VITALS — BP 168/75 | HR 70 | Temp 98.0°F | Resp 16 | Ht 67.0 in | Wt 162.1 lb

## 2021-11-08 DIAGNOSIS — C183 Malignant neoplasm of hepatic flexure: Secondary | ICD-10-CM | POA: Diagnosis not present

## 2021-11-08 NOTE — Telephone Encounter (Signed)
Per 11/08/21 los next appt scheduled and confirmed with patient ?

## 2021-11-08 NOTE — Telephone Encounter (Signed)
Pt was in clinic to see Dr Bobby Rumpf. I spoke with pt and his daughter,Rhonda, in Rm 2. Pt takes the Xeloda @ 7am w/food. He denies missed doses. He denies N/V, diarrhea, itching/rash, fevers, mouth sores, headaches and s/s hand-foot syndrome. His daughter mention she is using Udderly Soft cream TID on his hands. I reminded them both to please call us if he develops temp of 100.4 or higher, day or night. Both verbalized understanding. ? ?Pt started next cycle/bottle, yesterday morning. ?

## 2021-11-12 NOTE — Progress Notes (Signed)
?[facility]  ?[address] ?[place],  [place]  [address] ?[phone] ? ?Clinic Day:  11/08/2021 ? ?Referring physician: Serita Grammes, MD ? ? ?HISTORY OF PRESENT ILLNESS:  ?The patient is a 88 y.o. male who I recently began seeing for stage IIIB (T3 N1b M0) colon cancer, status post a right hemicolectomy in January 2023.  The patient ultimately decided to choose adjuvant oral therapy for his disease management.  He is currently taking Xeloda 1500 mg twice daily, every 2 out of 3 weeks.  He comes in today to be evaluated before heading into his second cycle of Xeloda.  The patient claims to have tolerated his first cycle very well.  He denied having any significant diarrhea, hand-foot syndrome, stomatitis, or other symptoms which concerned him for Xeloda-related complications.  ? ?PHYSICAL EXAM:  ?Blood pressure (!) 168/75, pulse 70, temperature 98 ?F (36.7 ?C), resp. rate 16, height '5\' 7"'$  (1.702 m), weight 162 lb 1.6 oz (73.5 kg), SpO2 98 %. ?Wt Readings from Last 3 Encounters:  ?11/08/21 162 lb 1.6 oz (73.5 kg)  ?10/30/21 163 lb 11.2 oz (74.3 kg)  ?09/28/21 161 lb 6.4 oz (73.2 kg)  ? ?Body mass index is 25.39 kg/m?Marland Kitchen ?Performance status (ECOG): 1 - Symptomatic but completely ambulatory ?Physical Exam ?Constitutional:   ?   Appearance: Normal appearance. He is not ill-appearing.  ?HENT:  ?   Mouth/Throat:  ?   Mouth: Mucous membranes are moist.  ?   Pharynx: Oropharynx is clear. No oropharyngeal exudate or posterior oropharyngeal erythema.  ?Cardiovascular:  ?   Rate and Rhythm: Normal rate and regular rhythm.  ?   Heart sounds: No murmur heard. ?  No friction rub. No gallop.  ?Pulmonary:  ?   Effort: Pulmonary effort is normal. No respiratory distress.  ?   Breath sounds: Normal breath sounds. No wheezing, rhonchi or rales.  ?Abdominal:  ?   General: Bowel sounds are normal. There is no distension.  ?   Palpations: Abdomen is soft. There is no mass.  ?   Tenderness: There is  no abdominal tenderness.  ?Musculoskeletal:     ?   General: No swelling.  ?   Right lower leg: No edema.  ?   Left lower leg: No edema.  ?Lymphadenopathy:  ?   Cervical: No cervical adenopathy.  ?   Upper Body:  ?   Right upper body: No supraclavicular or axillary adenopathy.  ?   Left upper body: No supraclavicular or axillary adenopathy.  ?   Lower Body: No right inguinal adenopathy. No left inguinal adenopathy.  ?Skin: ?   General: Skin is warm.  ?   Coloration: Skin is not jaundiced.  ?   Findings: No lesion or rash.  ?Neurological:  ?   General: No focal deficit present.  ?   Mental Status: He is alert and oriented to person, place, and time. Mental status is at baseline.  ?Psychiatric:     ?   Mood and Affect: Mood normal.     ?   Behavior: Behavior normal.     ?   Thought Content: Thought content normal.  ? ?LABS:  ? ? Latest Reference Range & Units 10/30/21 00:00  ?Sodium 137 - 147  137 (E)  ?Potassium 3.5 - 5.1 mEq/L 3.7 (E)  ?Chloride 99 - 108  105 (E)  ?CO2 13 - 22  27 ! (E)  ?Glucose  103 (E)  ?BUN 4 - 21  18 (  E)  ?Creatinine 0.6 - 1.3  0.9 (E)  ?Calcium 8.7 - 10.7  9.4 (E)  ?Alkaline Phosphatase 25 - 125  64 (E)  ?Albumin 3.5 - 5.0  3.7 (E)  ?AST 14 - 40  20 (E)  ?ALT 10 - 40 U/L 16 (E)  ?Bilirubin, Total  0.6 (E)  ?WBC  6.9 (E)  ?RBC 3.87 - 5.11  4.66 (E)  ?Hemoglobin 13.5 - 17.5  11.9 ! (E)  ?HCT 41 - 53  38 ! (E)  ?Platelets 150 - 400 K/uL 295 (E)  ?NEUT#  4.90 (E)  ?!: Data is abnormal ?(E): External lab result ? ?ASSESSMENT & PLAN:  ?A 88 y.o. male who I was asked to consult upon for stage IIIB colon cancer, status post a right hemicolectomy in January 2023.  He will proceed with his 2nd of 8 planned cycles of oral Xeloda chemotherapy this week.  Overall, I am very pleased with how well he has tolerated his Xeloda thus far.  I will see him back in 3 weeks before he has into his third of 8 planned cycles of adjuvant oral Xeloda chemotherapy. The patient and his family understand all the plans  discussed today and are in agreement with them. ? ?Jorge Rabideau Macarthur Critchley, MD   ? ? ?  ?

## 2021-11-14 ENCOUNTER — Encounter: Payer: Self-pay | Admitting: Oncology

## 2021-11-15 ENCOUNTER — Ambulatory Visit: Payer: Medicare Other | Admitting: Oncology

## 2021-11-15 ENCOUNTER — Other Ambulatory Visit: Payer: Medicare Other

## 2021-11-18 ENCOUNTER — Encounter: Payer: Self-pay | Admitting: Oncology

## 2021-11-20 ENCOUNTER — Other Ambulatory Visit (HOSPITAL_COMMUNITY): Payer: Self-pay

## 2021-11-22 ENCOUNTER — Telehealth: Payer: Self-pay

## 2021-11-22 DIAGNOSIS — I10 Essential (primary) hypertension: Secondary | ICD-10-CM | POA: Diagnosis not present

## 2021-11-22 DIAGNOSIS — C189 Malignant neoplasm of colon, unspecified: Secondary | ICD-10-CM | POA: Diagnosis not present

## 2021-11-22 DIAGNOSIS — Z6825 Body mass index (BMI) 25.0-25.9, adult: Secondary | ICD-10-CM | POA: Diagnosis not present

## 2021-11-22 DIAGNOSIS — I480 Paroxysmal atrial fibrillation: Secondary | ICD-10-CM | POA: Diagnosis not present

## 2021-11-22 NOTE — Telephone Encounter (Signed)
I spoke with pt's daughter, Suanne Marker.  She states her dad is doing good. He is taking the medication twice daily w/food. No missed doses. No N/V, mouth sores, diarrhea, skin rashes/itching, and fevers. She is aware to call us if he develops temp of 100.4 or higher, day or night. I confirmed next appt, 4/19. ?

## 2021-11-27 ENCOUNTER — Other Ambulatory Visit (HOSPITAL_COMMUNITY): Payer: Self-pay

## 2021-11-29 ENCOUNTER — Other Ambulatory Visit: Payer: Self-pay

## 2021-11-29 ENCOUNTER — Inpatient Hospital Stay: Payer: Medicare Other | Attending: Oncology

## 2021-11-29 ENCOUNTER — Other Ambulatory Visit: Payer: Self-pay | Admitting: Oncology

## 2021-11-29 ENCOUNTER — Inpatient Hospital Stay (INDEPENDENT_AMBULATORY_CARE_PROVIDER_SITE_OTHER): Payer: Medicare Other | Admitting: Oncology

## 2021-11-29 VITALS — BP 154/84 | HR 61 | Temp 97.6°F | Resp 16 | Ht 67.0 in | Wt 161.7 lb

## 2021-11-29 DIAGNOSIS — C183 Malignant neoplasm of hepatic flexure: Secondary | ICD-10-CM | POA: Diagnosis not present

## 2021-11-29 DIAGNOSIS — C182 Malignant neoplasm of ascending colon: Secondary | ICD-10-CM

## 2021-11-29 LAB — HEPATIC FUNCTION PANEL
ALT: 17 U/L (ref 10–40)
AST: 25 (ref 14–40)
Alkaline Phosphatase: 74 (ref 25–125)
Bilirubin, Total: 0.7

## 2021-11-29 LAB — BASIC METABOLIC PANEL
BUN: 15 (ref 4–21)
CO2: 26 — AB (ref 13–22)
Chloride: 103 (ref 99–108)
Creatinine: 0.8 (ref 0.6–1.3)
Glucose: 88
Potassium: 4.2 mEq/L (ref 3.5–5.1)
Sodium: 138 (ref 137–147)

## 2021-11-29 LAB — CBC AND DIFFERENTIAL
HCT: 41 (ref 41–53)
Hemoglobin: 12.9 — AB (ref 13.5–17.5)
Neutrophils Absolute: 4.03
Platelets: 222 10*3/uL (ref 150–400)
WBC: 6.3

## 2021-11-29 LAB — COMPREHENSIVE METABOLIC PANEL
Albumin: 3.9 (ref 3.5–5.0)
Calcium: 9.5 (ref 8.7–10.7)

## 2021-11-29 LAB — CBC: RBC: 4.63 (ref 3.87–5.11)

## 2021-11-29 NOTE — Progress Notes (Addendum)
?[facility]  ?[address] ?[place],  [place]  [address] ?[phone] ? ?Clinic Day: 11/29/2021 ? ?Referring physician: Serita Grammes, MD ? ? ?HISTORY OF PRESENT ILLNESS:  ?The patient is a 88 y.o. male with stage IIIB (T3 N1b M0) colon cancer, status post a right hemicolectomy in January 2023.  The patient ultimately decided to choose adjuvant oral therapy for his disease management.  He is currently taking Xeloda 1500 mg twice daily, every 2 out of 3 weeks.  He comes in today to be evaluated before heading into his 3rd cycle of Xeloda.  The patient claims to have tolerated his 2nd cycle very well.  He denies having any significant diarrhea, hand-foot syndrome, stomatitis, or other symptoms which concerned him for Xeloda-related complications.  He also denies having any new GI symptoms which concern him for early disease recurrence. ? ?PHYSICAL EXAM:  ?Blood pressure (!) 154/84, pulse 61, temperature 97.6 ?F (36.4 ?C), resp. rate 16, height '5\' 7"'$  (1.702 m), weight 161 lb 11.2 oz (73.3 kg), SpO2 98 %. ?Wt Readings from Last 3 Encounters:  ?11/29/21 161 lb 11.2 oz (73.3 kg)  ?11/08/21 162 lb 1.6 oz (73.5 kg)  ?10/30/21 163 lb 11.2 oz (74.3 kg)  ? ?Body mass index is 25.33 kg/m?Marland Kitchen ?Performance status (ECOG): 1 - Symptomatic but completely ambulatory ?Physical Exam ?Constitutional:   ?   Appearance: Normal appearance. He is not ill-appearing.  ?HENT:  ?   Mouth/Throat:  ?   Mouth: Mucous membranes are moist.  ?   Pharynx: Oropharynx is clear. No oropharyngeal exudate or posterior oropharyngeal erythema.  ?Cardiovascular:  ?   Rate and Rhythm: Normal rate and regular rhythm.  ?   Heart sounds: No murmur heard. ?  No friction rub. No gallop.  ?Pulmonary:  ?   Effort: Pulmonary effort is normal. No respiratory distress.  ?   Breath sounds: Normal breath sounds. No wheezing, rhonchi or rales.  ?Abdominal:  ?   General: Bowel sounds are normal. There is no distension.  ?   Palpations:  Abdomen is soft. There is no mass.  ?   Tenderness: There is no abdominal tenderness.  ?Musculoskeletal:     ?   General: No swelling.  ?   Right lower leg: No edema.  ?   Left lower leg: No edema.  ?Lymphadenopathy:  ?   Cervical: No cervical adenopathy.  ?   Upper Body:  ?   Right upper body: No supraclavicular or axillary adenopathy.  ?   Left upper body: No supraclavicular or axillary adenopathy.  ?   Lower Body: No right inguinal adenopathy. No left inguinal adenopathy.  ?Skin: ?   General: Skin is warm.  ?   Coloration: Skin is not jaundiced.  ?   Findings: No lesion or rash.  ?Neurological:  ?   General: No focal deficit present.  ?   Mental Status: He is alert and oriented to person, place, and time. Mental status is at baseline.  ?Psychiatric:     ?   Mood and Affect: Mood normal.     ?   Behavior: Behavior normal.     ?   Thought Content: Thought content normal.  ? ?LABS:  ? ? ? ? ?ASSESSMENT & PLAN:  ?A 88 y.o. male with stage IIIB colon cancer, status post a right hemicolectomy in January 2023.  He will proceed with his 3rd of 8 planned cycles of oral Xeloda chemotherapy this week.  Overall, I  am very pleased with how well he has tolerated his Xeloda thus far.  I will see him back in 3 weeks before he has into his 4th and final cycle of adjuvant oral Xeloda chemotherapy. The patient understands all the plans discussed today and is  in agreement with them. ? ?Anali Cabanilla Macarthur Critchley, MD   ? ? ?  ?

## 2021-12-04 ENCOUNTER — Telehealth: Payer: Self-pay

## 2021-12-04 DIAGNOSIS — Z961 Presence of intraocular lens: Secondary | ICD-10-CM | POA: Diagnosis not present

## 2021-12-04 DIAGNOSIS — H3530 Unspecified macular degeneration: Secondary | ICD-10-CM | POA: Diagnosis not present

## 2021-12-04 DIAGNOSIS — H52223 Regular astigmatism, bilateral: Secondary | ICD-10-CM | POA: Diagnosis not present

## 2021-12-04 DIAGNOSIS — Z9849 Cataract extraction status, unspecified eye: Secondary | ICD-10-CM | POA: Diagnosis not present

## 2021-12-04 DIAGNOSIS — H5203 Hypermetropia, bilateral: Secondary | ICD-10-CM | POA: Diagnosis not present

## 2021-12-04 DIAGNOSIS — H524 Presbyopia: Secondary | ICD-10-CM | POA: Diagnosis not present

## 2021-12-04 DIAGNOSIS — H353132 Nonexudative age-related macular degeneration, bilateral, intermediate dry stage: Secondary | ICD-10-CM | POA: Diagnosis not present

## 2021-12-04 NOTE — Telephone Encounter (Signed)
I spoke with [facility]. He states he is having a good day. He is taking the Xeloda 3 pills @ breakfast and 3 pills at dinner. He denies missed doses. He only reports noticing little more fatigue than normal when he takes the medication. He denies N/V, diarrhea, fevers, skin rashes/itching. I reminded pt to call us if he develops temp of 100.4 or higher, day or night. ?

## 2021-12-07 ENCOUNTER — Other Ambulatory Visit (HOSPITAL_COMMUNITY): Payer: Self-pay

## 2021-12-11 ENCOUNTER — Encounter: Payer: Self-pay | Admitting: Oncology

## 2021-12-14 ENCOUNTER — Telehealth: Payer: Self-pay

## 2021-12-14 ENCOUNTER — Other Ambulatory Visit (HOSPITAL_COMMUNITY): Payer: Self-pay

## 2021-12-14 ENCOUNTER — Encounter: Payer: Self-pay | Admitting: Oncology

## 2021-12-14 DIAGNOSIS — C182 Malignant neoplasm of ascending colon: Secondary | ICD-10-CM

## 2021-12-14 DIAGNOSIS — C183 Malignant neoplasm of hepatic flexure: Secondary | ICD-10-CM

## 2021-12-14 MED ORDER — CAPECITABINE 500 MG PO TABS
1500.0000 mg | ORAL_TABLET | Freq: Two times a day (BID) | ORAL | 5 refills | Status: DC
  Filled 2021-12-14: qty 84, 21d supply, fill #0

## 2021-12-14 NOTE — Telephone Encounter (Signed)
Oral Chemotherapy Pharmacist Encounter ? ?Patient is taking xeloda medication for 14 days on and 7 days off. Prescription modified to include instructions.  ? ?Drema Halon, PharmD ?Hematology/Oncology Clinical Pharmacist ?[facility] ?[phone] ? ?

## 2021-12-15 ENCOUNTER — Other Ambulatory Visit: Payer: Self-pay | Admitting: Oncology

## 2021-12-20 ENCOUNTER — Other Ambulatory Visit: Payer: Self-pay | Admitting: Oncology

## 2021-12-20 ENCOUNTER — Inpatient Hospital Stay: Payer: Medicare Other | Attending: Oncology | Admitting: Oncology

## 2021-12-20 ENCOUNTER — Inpatient Hospital Stay: Payer: Medicare Other

## 2021-12-20 VITALS — BP 166/74 | HR 54 | Temp 97.6°F | Resp 16 | Ht 65.0 in | Wt 164.5 lb

## 2021-12-20 DIAGNOSIS — D649 Anemia, unspecified: Secondary | ICD-10-CM | POA: Diagnosis not present

## 2021-12-20 DIAGNOSIS — C183 Malignant neoplasm of hepatic flexure: Secondary | ICD-10-CM

## 2021-12-20 DIAGNOSIS — C185 Malignant neoplasm of splenic flexure: Secondary | ICD-10-CM

## 2021-12-20 LAB — BASIC METABOLIC PANEL
BUN: 14 (ref 4–21)
CO2: 26 — AB (ref 13–22)
Chloride: 104 (ref 99–108)
Creatinine: 0.8 (ref 0.6–1.3)
Glucose: 111
Potassium: 3.9 mEq/L (ref 3.5–5.1)
Sodium: 139 (ref 137–147)

## 2021-12-20 LAB — HEPATIC FUNCTION PANEL
ALT: 19 U/L (ref 10–40)
AST: 22 (ref 14–40)
Alkaline Phosphatase: 67 (ref 25–125)
Bilirubin, Total: 0.8

## 2021-12-20 LAB — CBC AND DIFFERENTIAL
HCT: 42 (ref 41–53)
Hemoglobin: 13.4 — AB (ref 13.5–17.5)
Neutrophils Absolute: 3.94
Platelets: 197 10*3/uL (ref 150–400)
WBC: 5.8

## 2021-12-20 LAB — COMPREHENSIVE METABOLIC PANEL
Albumin: 4 (ref 3.5–5.0)
Calcium: 9.4 (ref 8.7–10.7)

## 2021-12-20 LAB — CBC: RBC: 4.57 (ref 3.87–5.11)

## 2021-12-20 NOTE — Progress Notes (Signed)
?[facility]  ?[address] ?[place],  [place]  [address] ?[phone] ? ?Clinic Day: 12/20/2021 ? ?Referring physician: Serita Grammes, MD ? ? ?HISTORY OF PRESENT ILLNESS:  ?The patient is a 88 y.o. male with stage IIIB (T3 N1b M0) colon cancer, status post a right hemicolectomy in January 2023.  The patient ultimately decided to choose adjuvant oral therapy for his disease management.  He is currently taking Xeloda 1500 mg twice daily, every 2 out of 3 weeks.  He comes in today to be evaluated before heading into his 4th and final cycle of Xeloda.  The patient claims to have tolerated his 3rd cycle very well.  He denies having any significant diarrhea, hand-foot syndrome, stomatitis, or other symptoms which concern him for Xeloda-related complications.  He also denies having any new GI symptoms which concern him for early disease recurrence. ? ?PHYSICAL EXAM:  ?Blood pressure (!) 166/74, pulse (!) 54, temperature 97.6 ?F (36.4 ?C), resp. rate 16, height '5\' 5"'$  (1.651 m), weight 164 lb 8 oz (74.6 kg), SpO2 100 %. ?Wt Readings from Last 3 Encounters:  ?12/20/21 164 lb 8 oz (74.6 kg)  ?11/29/21 161 lb 11.2 oz (73.3 kg)  ?11/08/21 162 lb 1.6 oz (73.5 kg)  ? ?Body mass index is 27.37 kg/m?Marland Kitchen ?Performance status (ECOG): 1 - Symptomatic but completely ambulatory ?Physical Exam ?Constitutional:   ?   Appearance: Normal appearance. He is not ill-appearing.  ?HENT:  ?   Mouth/Throat:  ?   Mouth: Mucous membranes are moist.  ?   Pharynx: Oropharynx is clear. No oropharyngeal exudate or posterior oropharyngeal erythema.  ?Cardiovascular:  ?   Rate and Rhythm: Normal rate and regular rhythm.  ?   Heart sounds: No murmur heard. ?  No friction rub. No gallop.  ?Pulmonary:  ?   Effort: Pulmonary effort is normal. No respiratory distress.  ?   Breath sounds: Normal breath sounds. No wheezing, rhonchi or rales.  ?Abdominal:  ?   General: Bowel sounds are normal. There is no distension.  ?    Palpations: Abdomen is soft. There is no mass.  ?   Tenderness: There is no abdominal tenderness.  ?Musculoskeletal:     ?   General: No swelling.  ?   Right lower leg: No edema.  ?   Left lower leg: No edema.  ?Lymphadenopathy:  ?   Cervical: No cervical adenopathy.  ?   Upper Body:  ?   Right upper body: No supraclavicular or axillary adenopathy.  ?   Left upper body: No supraclavicular or axillary adenopathy.  ?   Lower Body: No right inguinal adenopathy. No left inguinal adenopathy.  ?Skin: ?   General: Skin is warm.  ?   Coloration: Skin is not jaundiced.  ?   Findings: No lesion or rash.  ?Neurological:  ?   General: No focal deficit present.  ?   Mental Status: He is alert and oriented to person, place, and time. Mental status is at baseline.  ?Psychiatric:     ?   Mood and Affect: Mood normal.     ?   Behavior: Behavior normal.     ?   Thought Content: Thought content normal.  ? ?LABS:  ? ? ? ?ASSESSMENT & PLAN:  ?A 88 y.o. male with stage IIIB colon cancer, status post a right hemicolectomy in January 2023.  He will proceed with his 4th and final cycle of oral Xeloda chemotherapy this week.  Overall,  I am very pleased with how well he has tolerated his Xeloda thus far.  I will see him back in 8 weeks for repeat clinical assessment.  CT scans will be done before his next visit to ascertain his new disease baseline after the completion of all of his oral Xeloda chemotherapy. The patient understands all the plans discussed today and is  in agreement with them. ? ?Svetlana Bagby Macarthur Critchley, MD   ? ? ?  ?

## 2021-12-26 ENCOUNTER — Other Ambulatory Visit (HOSPITAL_COMMUNITY): Payer: Self-pay

## 2022-01-02 ENCOUNTER — Other Ambulatory Visit (HOSPITAL_COMMUNITY): Payer: Self-pay

## 2022-01-10 ENCOUNTER — Other Ambulatory Visit (HOSPITAL_COMMUNITY): Payer: Self-pay

## 2022-01-25 ENCOUNTER — Telehealth: Payer: Self-pay | Admitting: Oncology

## 2022-01-25 NOTE — Telephone Encounter (Signed)
CT A/P has been scheduled for  02/14/22 @ 11:30 am; checking in at 10:30  **Contrast to be picked up by 02/12/22**  Notified pt via vm of date,time and instructions.

## 2022-02-15 ENCOUNTER — Ambulatory Visit: Payer: Medicare Other | Admitting: Oncology

## 2022-02-22 ENCOUNTER — Ambulatory Visit: Payer: Medicare Other | Admitting: Oncology

## 2022-02-23 ENCOUNTER — Ambulatory Visit: Payer: Medicare Other | Admitting: Oncology

## 2022-03-01 ENCOUNTER — Ambulatory Visit: Payer: Medicare Other | Admitting: Oncology

## 2022-03-01 DIAGNOSIS — K828 Other specified diseases of gallbladder: Secondary | ICD-10-CM | POA: Diagnosis not present

## 2022-03-01 DIAGNOSIS — C183 Malignant neoplasm of hepatic flexure: Secondary | ICD-10-CM | POA: Diagnosis not present

## 2022-03-01 DIAGNOSIS — K7689 Other specified diseases of liver: Secondary | ICD-10-CM | POA: Diagnosis not present

## 2022-03-01 DIAGNOSIS — C189 Malignant neoplasm of colon, unspecified: Secondary | ICD-10-CM | POA: Diagnosis not present

## 2022-03-01 NOTE — Progress Notes (Signed)
[facility]  [address] [place],  [place]  [address] [phone]  Clinic Day: 03/02/2022  Referring physician: Serita Grammes, MD   HISTORY OF PRESENT ILLNESS:  The patient is a 88 y.o. male with stage IIIB (T3 N1b M0) colon cancer, status post a right hemicolectomy in January 2023.  The patient ultimately decided to choose adjuvant oral therapy for his disease management.  He is currently taking Xeloda 1500 mg twice daily, every 2 out of 3 weeks.  He comes in today to be evaluated before heading into his 4th and final cycle of Xeloda.  The patient claims to have tolerated his 3rd cycle very well.  He denies having any significant diarrhea, hand-foot syndrome, stomatitis, or other symptoms which concern him for Xeloda-related complications.  He also denies having any new GI symptoms which concern him for early disease recurrence.  PHYSICAL EXAM:  Blood pressure (!) 147/67, pulse 77, temperature 98.1 F (36.7 C), resp. rate 16, height '5\' 5"'$  (1.651 m), weight 163 lb 3.2 oz (74 kg), SpO2 96 %. Wt Readings from Last 3 Encounters:  03/02/22 163 lb 3.2 oz (74 kg)  12/20/21 164 lb 8 oz (74.6 kg)  11/29/21 161 lb 11.2 oz (73.3 kg)   Body mass index is 27.16 kg/m. Performance status (ECOG): 1 - Symptomatic but completely ambulatory Physical Exam Constitutional:      Appearance: Normal appearance. He is not ill-appearing.  HENT:     Mouth/Throat:     Mouth: Mucous membranes are moist.     Pharynx: Oropharynx is clear. No oropharyngeal exudate or posterior oropharyngeal erythema.  Cardiovascular:     Rate and Rhythm: Normal rate and regular rhythm.     Heart sounds: No murmur heard.    No friction rub. No gallop.  Pulmonary:     Effort: Pulmonary effort is normal. No respiratory distress.     Breath sounds: Normal breath sounds. No wheezing, rhonchi or rales.  Abdominal:     General: Bowel sounds are normal. There is no distension.     Palpations:  Abdomen is soft. There is no mass.     Tenderness: There is no abdominal tenderness.  Musculoskeletal:        General: No swelling.     Right lower leg: No edema.     Left lower leg: No edema.  Lymphadenopathy:     Cervical: No cervical adenopathy.     Upper Body:     Right upper body: No supraclavicular or axillary adenopathy.     Left upper body: No supraclavicular or axillary adenopathy.     Lower Body: No right inguinal adenopathy. No left inguinal adenopathy.  Skin:    General: Skin is warm.     Coloration: Skin is not jaundiced.     Findings: No lesion or rash.  Neurological:     General: No focal deficit present.     Mental Status: He is alert and oriented to person, place, and time. Mental status is at baseline.  Psychiatric:        Mood and Affect: Mood normal.        Behavior: Behavior normal.        Thought Content: Thought content normal.   SCANS: CT scans of his abdomen/pelvis revealed the following:  LABS:     ASSESSMENT & PLAN:  A 88 y.o. male with stage IIIB colon cancer, status post a right hemicolectomy in January 2023.  He will proceed with his 4th and  final cycle of oral Xeloda chemotherapy this week.  Overall, I am very pleased with how well he has tolerated his Xeloda thus far.  I will see him back in 8 weeks for repeat clinical assessment.  CT scans will be done before his next visit to ascertain his new disease baseline after the completion of all of his oral Xeloda chemotherapy. The patient understands all the plans discussed today and is  in agreement with them.  Lizeth Bencosme Macarthur Critchley, MD

## 2022-03-01 NOTE — Progress Notes (Incomplete)
[facility]  [address] [place],  [place]  [address] [phone]  Clinic Day: 12/20/2021  Referring physician: Serita Grammes, MD   HISTORY OF PRESENT ILLNESS:  The patient is a 88 y.o. male with stage IIIB (T3 N1b M0) colon cancer, status post a right hemicolectomy in January 2023.  The patient ultimately decided to choose adjuvant oral therapy for his disease management.  He is currently taking Xeloda 1500 mg twice daily, every 2 out of 3 weeks.  He comes in today to be evaluated before heading into his 4th and final cycle of Xeloda.  The patient claims to have tolerated his 3rd cycle very well.  He denies having any significant diarrhea, hand-foot syndrome, stomatitis, or other symptoms which concern him for Xeloda-related complications.  He also denies having any new GI symptoms which concern him for early disease recurrence.  PHYSICAL EXAM:  There were no vitals taken for this visit. Wt Readings from Last 3 Encounters:  12/20/21 164 lb 8 oz (74.6 kg)  11/29/21 161 lb 11.2 oz (73.3 kg)  11/08/21 162 lb 1.6 oz (73.5 kg)   There is no height or weight on file to calculate BMI. Performance status (ECOG): 1 - Symptomatic but completely ambulatory Physical Exam Constitutional:      Appearance: Normal appearance. He is not ill-appearing.  HENT:     Mouth/Throat:     Mouth: Mucous membranes are moist.     Pharynx: Oropharynx is clear. No oropharyngeal exudate or posterior oropharyngeal erythema.  Cardiovascular:     Rate and Rhythm: Normal rate and regular rhythm.     Heart sounds: No murmur heard.    No friction rub. No gallop.  Pulmonary:     Effort: Pulmonary effort is normal. No respiratory distress.     Breath sounds: Normal breath sounds. No wheezing, rhonchi or rales.  Abdominal:     General: Bowel sounds are normal. There is no distension.     Palpations: Abdomen is soft. There is no mass.     Tenderness: There is no abdominal  tenderness.  Musculoskeletal:        General: No swelling.     Right lower leg: No edema.     Left lower leg: No edema.  Lymphadenopathy:     Cervical: No cervical adenopathy.     Upper Body:     Right upper body: No supraclavicular or axillary adenopathy.     Left upper body: No supraclavicular or axillary adenopathy.     Lower Body: No right inguinal adenopathy. No left inguinal adenopathy.  Skin:    General: Skin is warm.     Coloration: Skin is not jaundiced.     Findings: No lesion or rash.  Neurological:     General: No focal deficit present.     Mental Status: He is alert and oriented to person, place, and time. Mental status is at baseline.  Psychiatric:        Mood and Affect: Mood normal.        Behavior: Behavior normal.        Thought Content: Thought content normal.   SCANS: CT scans of his abdomen/pelvis revealed the following:  LABS:     ASSESSMENT & PLAN:  A 88 y.o. male with stage IIIB colon cancer, status post a right hemicolectomy in January 2023.  He will proceed with his 4th and final cycle of oral Xeloda chemotherapy this week.  Overall, I am very pleased with how  well he has tolerated his Xeloda thus far.  I will see him back in 8 weeks for repeat clinical assessment.  CT scans will be done before his next visit to ascertain his new disease baseline after the completion of all of his oral Xeloda chemotherapy. The patient understands all the plans discussed today and is  in agreement with them.  Oveta Idris Macarthur Critchley, MD

## 2022-03-02 ENCOUNTER — Inpatient Hospital Stay: Payer: Medicare Other | Attending: Oncology | Admitting: Oncology

## 2022-03-02 ENCOUNTER — Other Ambulatory Visit: Payer: Self-pay | Admitting: Oncology

## 2022-03-02 VITALS — BP 147/67 | HR 77 | Temp 98.1°F | Resp 16 | Ht 65.0 in | Wt 163.2 lb

## 2022-03-02 DIAGNOSIS — C183 Malignant neoplasm of hepatic flexure: Secondary | ICD-10-CM

## 2022-03-02 DIAGNOSIS — C186 Malignant neoplasm of descending colon: Secondary | ICD-10-CM | POA: Diagnosis not present

## 2022-03-05 ENCOUNTER — Other Ambulatory Visit: Payer: Self-pay

## 2022-03-05 ENCOUNTER — Encounter: Payer: Self-pay | Admitting: Oncology

## 2022-03-13 ENCOUNTER — Other Ambulatory Visit: Payer: Self-pay

## 2022-04-10 ENCOUNTER — Other Ambulatory Visit: Payer: Self-pay

## 2022-04-14 ENCOUNTER — Other Ambulatory Visit: Payer: Self-pay | Admitting: Oncology

## 2022-05-31 DIAGNOSIS — Z0000 Encounter for general adult medical examination without abnormal findings: Secondary | ICD-10-CM | POA: Diagnosis not present

## 2022-05-31 DIAGNOSIS — Z13220 Encounter for screening for lipoid disorders: Secondary | ICD-10-CM | POA: Diagnosis not present

## 2022-05-31 DIAGNOSIS — I10 Essential (primary) hypertension: Secondary | ICD-10-CM | POA: Diagnosis not present

## 2022-05-31 DIAGNOSIS — I480 Paroxysmal atrial fibrillation: Secondary | ICD-10-CM | POA: Diagnosis not present

## 2022-05-31 DIAGNOSIS — C189 Malignant neoplasm of colon, unspecified: Secondary | ICD-10-CM | POA: Diagnosis not present

## 2022-05-31 DIAGNOSIS — Z79899 Other long term (current) drug therapy: Secondary | ICD-10-CM | POA: Diagnosis not present

## 2022-05-31 DIAGNOSIS — R7302 Impaired glucose tolerance (oral): Secondary | ICD-10-CM | POA: Diagnosis not present

## 2022-06-10 ENCOUNTER — Other Ambulatory Visit: Payer: Self-pay | Admitting: Gastroenterology

## 2022-06-10 DIAGNOSIS — D500 Iron deficiency anemia secondary to blood loss (chronic): Secondary | ICD-10-CM

## 2022-06-10 DIAGNOSIS — R634 Abnormal weight loss: Secondary | ICD-10-CM

## 2022-06-10 DIAGNOSIS — R1084 Generalized abdominal pain: Secondary | ICD-10-CM

## 2022-07-09 ENCOUNTER — Inpatient Hospital Stay: Payer: Medicare Other | Attending: Oncology

## 2022-07-09 DIAGNOSIS — C183 Malignant neoplasm of hepatic flexure: Secondary | ICD-10-CM

## 2022-07-09 DIAGNOSIS — Z9049 Acquired absence of other specified parts of digestive tract: Secondary | ICD-10-CM | POA: Diagnosis not present

## 2022-07-09 DIAGNOSIS — Z85038 Personal history of other malignant neoplasm of large intestine: Secondary | ICD-10-CM | POA: Insufficient documentation

## 2022-07-09 NOTE — Progress Notes (Signed)
[facility]  [address] [place],  [place]  [address] [phone]  Clinic Day: 03/02/2022  Referring physician: Serita Grammes, MD   HISTORY OF PRESENT ILLNESS:  The patient is a 88 y.o. male with stage IIIB (T3 N1b M0) colon cancer, status post a right hemicolectomy in January 2023.  The patient ultimately decided to choose adjuvant oral therapy for his disease management.  He is currently taking Xeloda 1500 mg twice daily, every 2 out of 3 weeks.  He comes in today to be evaluated before heading into his 4th and final cycle of Xeloda.  The patient claims to have tolerated his 3rd cycle very well.  He denies having any significant diarrhea, hand-foot syndrome, stomatitis, or other symptoms which concern him for Xeloda-related complications.  He also denies having any new GI symptoms which concern him for early disease recurrence.  PHYSICAL EXAM:  There were no vitals taken for this visit. Wt Readings from Last 3 Encounters:  03/02/22 163 lb 3.2 oz (74 kg)  12/20/21 164 lb 8 oz (74.6 kg)  11/29/21 161 lb 11.2 oz (73.3 kg)   There is no height or weight on file to calculate BMI. Performance status (ECOG): 1 - Symptomatic but completely ambulatory Physical Exam Constitutional:      Appearance: Normal appearance. He is not ill-appearing.  HENT:     Mouth/Throat:     Mouth: Mucous membranes are moist.     Pharynx: Oropharynx is clear. No oropharyngeal exudate or posterior oropharyngeal erythema.  Cardiovascular:     Rate and Rhythm: Normal rate and regular rhythm.     Heart sounds: No murmur heard.    No friction rub. No gallop.  Pulmonary:     Effort: Pulmonary effort is normal. No respiratory distress.     Breath sounds: Normal breath sounds. No wheezing, rhonchi or rales.  Abdominal:     General: Bowel sounds are normal. There is no distension.     Palpations: Abdomen is soft. There is no mass.     Tenderness: There is no abdominal  tenderness.  Musculoskeletal:        General: No swelling.     Right lower leg: No edema.     Left lower leg: No edema.  Lymphadenopathy:     Cervical: No cervical adenopathy.     Upper Body:     Right upper body: No supraclavicular or axillary adenopathy.     Left upper body: No supraclavicular or axillary adenopathy.     Lower Body: No right inguinal adenopathy. No left inguinal adenopathy.  Skin:    General: Skin is warm.     Coloration: Skin is not jaundiced.     Findings: No lesion or rash.  Neurological:     General: No focal deficit present.     Mental Status: He is alert and oriented to person, place, and time. Mental status is at baseline.  Psychiatric:        Mood and Affect: Mood normal.        Behavior: Behavior normal.        Thought Content: Thought content normal.  SCANS: CT scans of his abdomen/pelvis revealed the following:  LABS:     ASSESSMENT & PLAN:  A 89 y.o. male with stage IIIB colon cancer, status post a right hemicolectomy in January 2023.  He will proceed with his 4th and final cycle of oral Xeloda chemotherapy this week.  Overall, I am very pleased with how well  he has tolerated his Xeloda thus far.  I will see him back in 8 weeks for repeat clinical assessment.  CT scans will be done before his next visit to ascertain his new disease baseline after the completion of all of his oral Xeloda chemotherapy. The patient understands all the plans discussed today and is  in agreement with them.  Jahari Billy Macarthur Critchley, MD

## 2022-07-10 ENCOUNTER — Other Ambulatory Visit: Payer: Self-pay | Admitting: Oncology

## 2022-07-10 ENCOUNTER — Inpatient Hospital Stay (INDEPENDENT_AMBULATORY_CARE_PROVIDER_SITE_OTHER): Payer: Medicare Other | Admitting: Oncology

## 2022-07-10 ENCOUNTER — Telehealth: Payer: Self-pay | Admitting: Oncology

## 2022-07-10 ENCOUNTER — Other Ambulatory Visit: Payer: Medicare Other

## 2022-07-10 VITALS — BP 160/72 | HR 68 | Temp 98.0°F | Resp 16 | Ht 65.0 in | Wt 161.9 lb

## 2022-07-10 DIAGNOSIS — C182 Malignant neoplasm of ascending colon: Secondary | ICD-10-CM | POA: Diagnosis not present

## 2022-07-10 DIAGNOSIS — C183 Malignant neoplasm of hepatic flexure: Secondary | ICD-10-CM

## 2022-07-10 LAB — CEA: CEA: 4.7 ng/mL (ref 0.0–4.7)

## 2022-07-10 NOTE — Telephone Encounter (Signed)
07/10/22 Next appt scheduled and confirmed with patient 

## 2022-07-11 ENCOUNTER — Telehealth: Payer: Self-pay

## 2022-07-11 NOTE — Telephone Encounter (Signed)
Dr Bobby Rumpf:  Let pt and his daughter know that his CEA level was normal at 4.7   Pt's daughter notified @ 1332, and verbalized understanding.

## 2022-09-11 ENCOUNTER — Encounter: Payer: Self-pay | Admitting: Gastroenterology

## 2022-11-06 ENCOUNTER — Inpatient Hospital Stay: Payer: Medicare Other | Attending: Oncology

## 2022-11-06 DIAGNOSIS — Z9221 Personal history of antineoplastic chemotherapy: Secondary | ICD-10-CM | POA: Diagnosis not present

## 2022-11-06 DIAGNOSIS — Z08 Encounter for follow-up examination after completed treatment for malignant neoplasm: Secondary | ICD-10-CM | POA: Diagnosis not present

## 2022-11-06 DIAGNOSIS — Z85038 Personal history of other malignant neoplasm of large intestine: Secondary | ICD-10-CM | POA: Diagnosis not present

## 2022-11-06 DIAGNOSIS — C183 Malignant neoplasm of hepatic flexure: Secondary | ICD-10-CM

## 2022-11-06 LAB — CMP (CANCER CENTER ONLY)
ALT: 17 U/L (ref 0–44)
AST: 18 U/L (ref 15–41)
Albumin: 3.8 g/dL (ref 3.5–5.0)
Alkaline Phosphatase: 62 U/L (ref 38–126)
Anion gap: 9 (ref 5–15)
BUN: 18 mg/dL (ref 8–23)
CO2: 24 mmol/L (ref 22–32)
Calcium: 9.6 mg/dL (ref 8.9–10.3)
Chloride: 103 mmol/L (ref 98–111)
Creatinine: 0.97 mg/dL (ref 0.61–1.24)
GFR, Estimated: >60 mL/min (ref >60)
Glucose, Bld: 90 mg/dL (ref 70–99)
Potassium: 3.8 mmol/L (ref 3.5–5.1)
Sodium: 136 mmol/L (ref 135–145)
Total Bilirubin: 0.8 mg/dL (ref 0.3–1.2)
Total Protein: 7 g/dL (ref 6.5–8.1)

## 2022-11-06 LAB — CBC WITH DIFFERENTIAL (CANCER CENTER ONLY)
Abs Immature Granulocytes: 0.03 10*3/uL (ref 0.00–0.07)
Basophils Absolute: 0.1 10*3/uL (ref 0.0–0.1)
Basophils Relative: 1 %
Eosinophils Absolute: 0.1 10*3/uL (ref 0.0–0.5)
Eosinophils Relative: 2 %
HCT: 47.4 % (ref 39.0–52.0)
Hemoglobin: 15.7 g/dL (ref 13.0–17.0)
Immature Granulocytes: 0 %
Lymphocytes Relative: 20 %
Lymphs Abs: 1.3 10*3/uL (ref 0.7–4.0)
MCH: 29.7 pg (ref 26.0–34.0)
MCHC: 33.1 g/dL (ref 30.0–36.0)
MCV: 89.6 fL (ref 80.0–100.0)
Monocytes Absolute: 0.6 10*3/uL (ref 0.1–1.0)
Monocytes Relative: 9 %
Neutro Abs: 4.6 10*3/uL (ref 1.7–7.7)
Neutrophils Relative %: 68 %
Platelet Count: 209 10*3/uL (ref 150–400)
RBC: 5.29 MIL/uL (ref 4.22–5.81)
RDW: 13.5 % (ref 11.5–15.5)
WBC Count: 6.7 10*3/uL (ref 4.0–10.5)
nRBC: 0 % (ref 0.0–0.2)

## 2022-11-08 ENCOUNTER — Ambulatory Visit: Payer: Medicare Other | Admitting: Oncology

## 2022-11-08 LAB — CEA: CEA: 5.2 ng/mL — ABNORMAL HIGH (ref 0.0–4.7)

## 2022-11-18 NOTE — Progress Notes (Signed)
[facility]  [address] [place],  [place]  [id number] [phone]  Clinic Day: 07/10/2022  Referring physician: Buckner MaltaBurgart, Jennifer, MD   HISTORY OF PRESENT ILLNESS:  The patient is a 89 y.o. male with stage IIIB (T3 N1b M0) colon cancer, status post a right hemicolectomy in January 2023.  This was followed by 4 cycles of Xeloda, which he completed in May 2023.  Scans done afterwards showed him to be disease-free.  He comes in today for routine follow-up.  Since his last visit, the patient has been doing very well.  He denies having abdominal pain, changes in bowel habits, or other GI symptoms which concern him for early disease recurrence.  PHYSICAL EXAM:  There were no vitals taken for this visit. Wt Readings from Last 3 Encounters:  07/10/22 161 lb 14.4 oz (73.4 kg)  03/02/22 163 lb 3.2 oz (74 kg)  12/20/21 164 lb 8 oz (74.6 kg)   There is no height or weight on file to calculate BMI. Performance status (ECOG): 1 - Symptomatic but completely ambulatory Physical Exam Constitutional:      Appearance: Normal appearance. He is not ill-appearing.  HENT:     Mouth/Throat:     Mouth: Mucous membranes are moist.     Pharynx: Oropharynx is clear. No oropharyngeal exudate or posterior oropharyngeal erythema.  Cardiovascular:     Rate and Rhythm: Normal rate and regular rhythm.     Heart sounds: No murmur heard.    No friction rub. No gallop.  Pulmonary:     Effort: Pulmonary effort is normal. No respiratory distress.     Breath sounds: Normal breath sounds. No wheezing, rhonchi or rales.  Abdominal:     General: Bowel sounds are normal. There is no distension.     Palpations: Abdomen is soft. There is no mass.     Tenderness: There is no abdominal tenderness.  Musculoskeletal:        General: No swelling.     Right lower leg: No edema.     Left lower leg: No edema.  Lymphadenopathy:     Cervical: No cervical adenopathy.     Upper Body:      Right upper body: No supraclavicular or axillary adenopathy.     Left upper body: No supraclavicular or axillary adenopathy.     Lower Body: No right inguinal adenopathy. No left inguinal adenopathy.  Skin:    General: Skin is warm.     Coloration: Skin is not jaundiced.     Findings: No lesion or rash.  Neurological:     General: No focal deficit present.     Mental Status: He is alert and oriented to person, place, and time. Mental status is at baseline.  Psychiatric:        Mood and Affect: Mood normal.        Behavior: Behavior normal.        Thought Content: Thought content normal.   LABS:    Latest Reference Range & Units 07/09/22 09:42 11/06/22 10:05  CEA 0.0 - 4.7 ng/mL 4.7 5.2 (H)  (H): Data is abnormally high  ASSESSMENT & PLAN:  A 89 y.o. male with stage IIIB colon cancer, status post a right hemicolectomy in January 2023, followed by 4 cycles of adjuvant Xeloda, which were completed in May 2023.  Based upon his labs and physical exam today, the patient remains disease-free.  Clinically, the patient is doing very well.  As that is the case,  I will see him back in another 4 months for repeat clinical assessment.  The patient understands all the plans discussed today and is  in agreement with them.  Laquetta Racey Kirby Funk, MD

## 2022-11-19 ENCOUNTER — Inpatient Hospital Stay: Payer: Medicare Other | Attending: Oncology | Admitting: Oncology

## 2022-11-19 ENCOUNTER — Other Ambulatory Visit: Payer: Self-pay | Admitting: Oncology

## 2022-11-19 VITALS — BP 145/68 | HR 73 | Temp 98.3°F | Resp 16 | Ht 65.0 in | Wt 164.4 lb

## 2022-11-19 DIAGNOSIS — C182 Malignant neoplasm of ascending colon: Secondary | ICD-10-CM

## 2022-11-30 DIAGNOSIS — I480 Paroxysmal atrial fibrillation: Secondary | ICD-10-CM | POA: Diagnosis not present

## 2022-11-30 DIAGNOSIS — C189 Malignant neoplasm of colon, unspecified: Secondary | ICD-10-CM | POA: Diagnosis not present

## 2022-11-30 DIAGNOSIS — I10 Essential (primary) hypertension: Secondary | ICD-10-CM | POA: Diagnosis not present

## 2022-11-30 DIAGNOSIS — Z6825 Body mass index (BMI) 25.0-25.9, adult: Secondary | ICD-10-CM | POA: Diagnosis not present

## 2022-12-26 IMAGING — CT CT ABD-PELV W/ CM
2 of 5 series · 16 of 46 positions shown, 18 images · IV contrast (Omnipaque)
Comparison: None.

CLINICAL DATA: Generalized abdominal pain. Weight loss. Blood in
stool. Iron deficiency anemia.

EXAM:
CT ABDOMEN AND PELVIS WITH CONTRAST
TECHNIQUE: Multidetector CT imaging of the abdomen and pelvis was performed
using the standard protocol following bolus administration of
intravenous contrast.
CONTRAST:  80mL OMNIPAQUE IOHEXOL 300 MG/ML  SOLN

[Series 2: axial st · axial · 0.85mm/px · z∈[+792,+1157]mm · 13 of 83 slices shown, 15 images]
[im 5/83  soft-tissue]
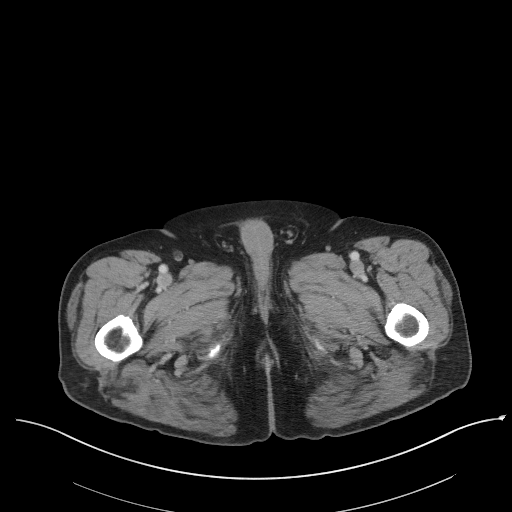
[im 5/83  bone]
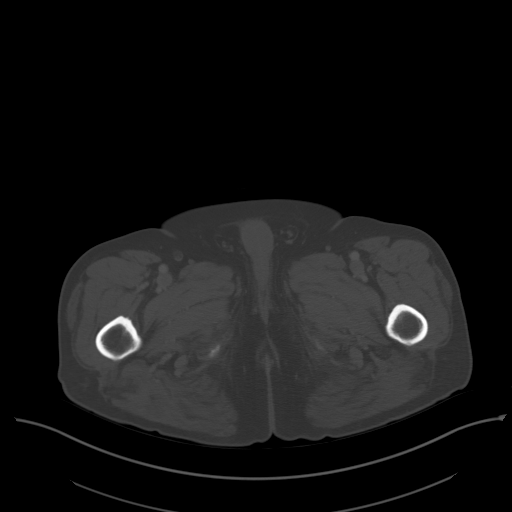
[im 13/83  soft-tissue]
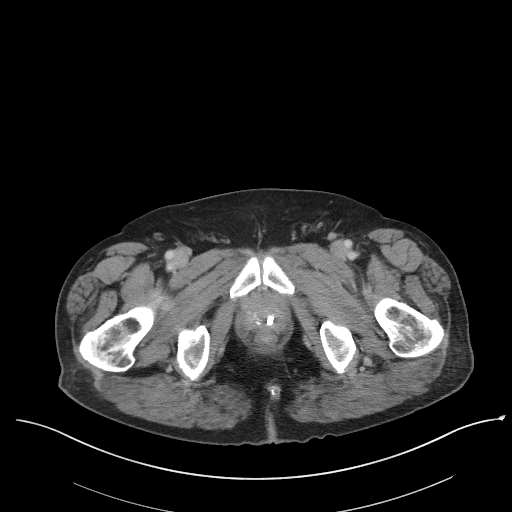
[im 18/83  soft-tissue]
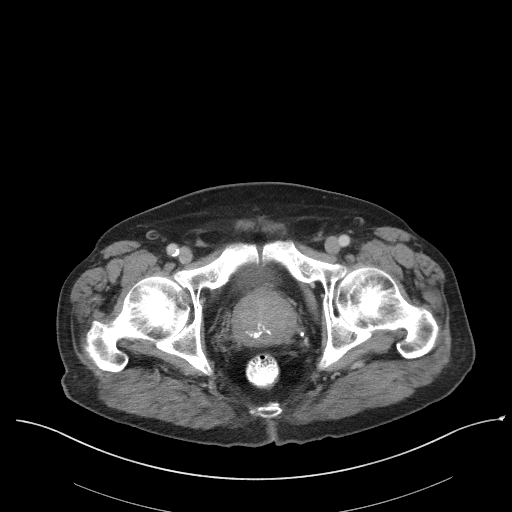
[im 22/83  soft-tissue]
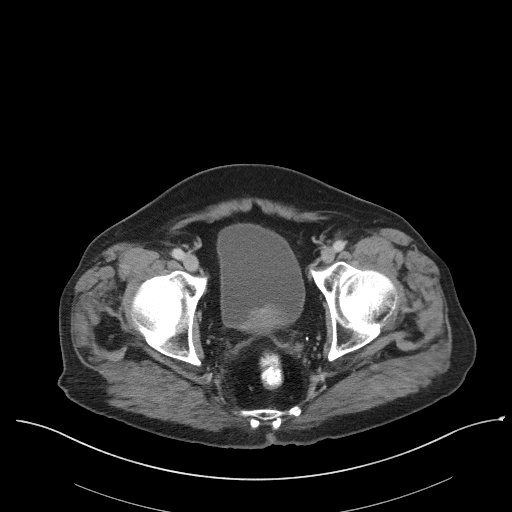
[im 31/83  soft-tissue]
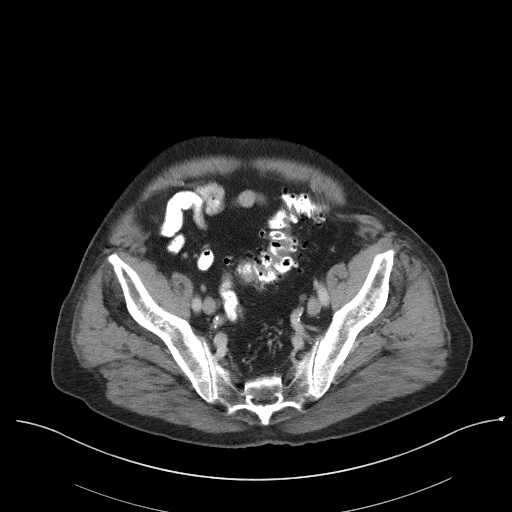
[im 35/83  soft-tissue]
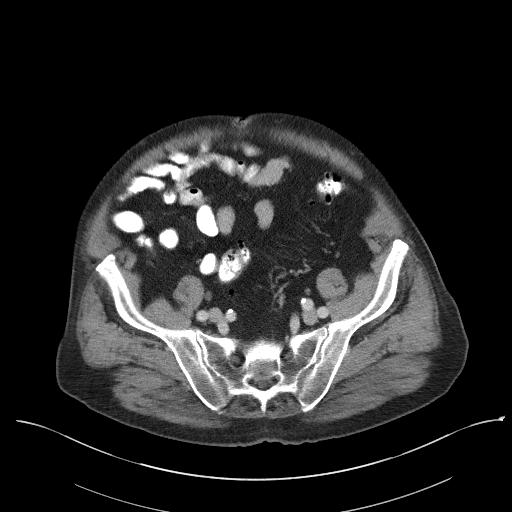
[im 44/83  soft-tissue]
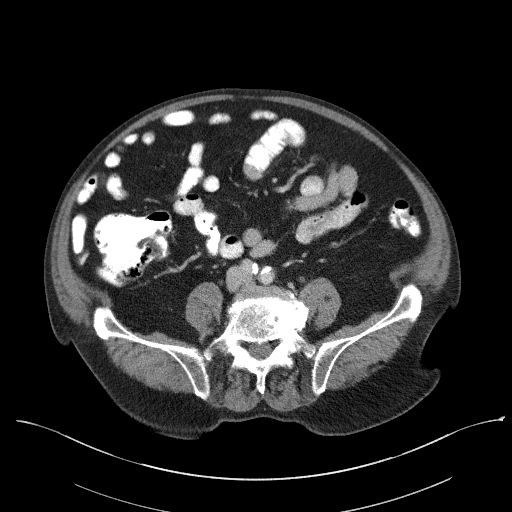
[im 48/83  soft-tissue]
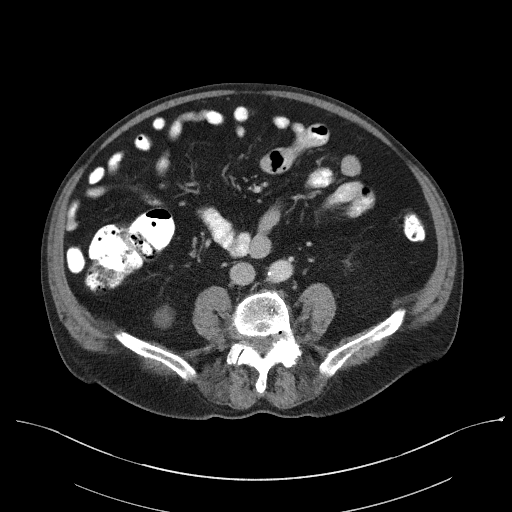
[im 52/83  soft-tissue]
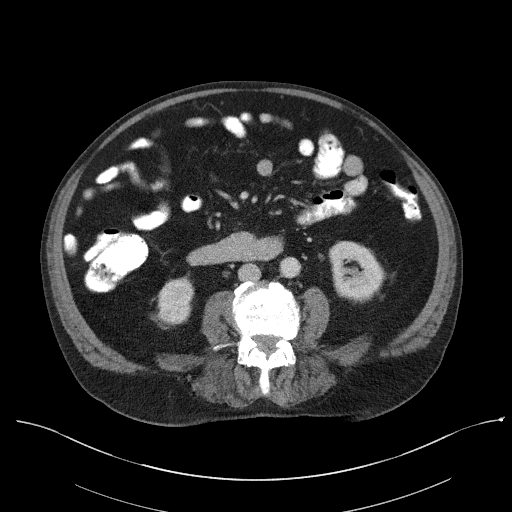
[im 52/83  bone]
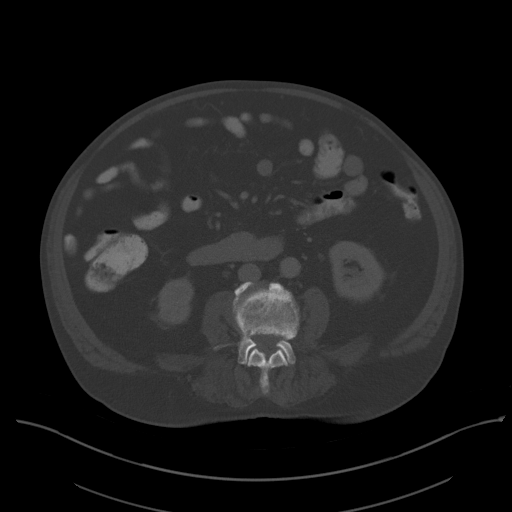
[im 61/83  soft-tissue]
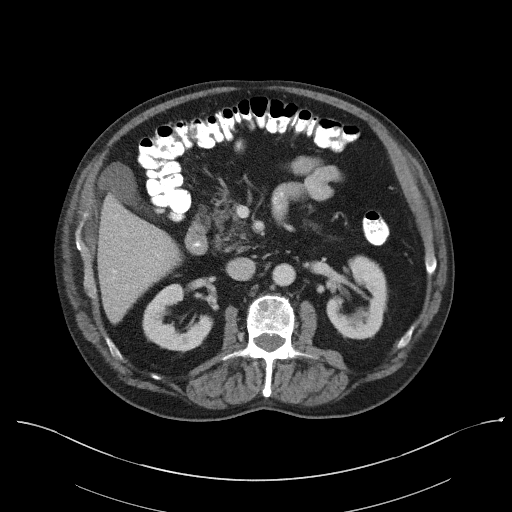
[im 65/83  soft-tissue]
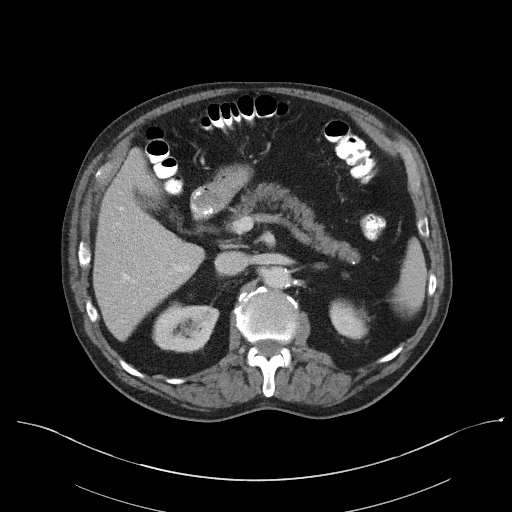
[im 70/83  soft-tissue]
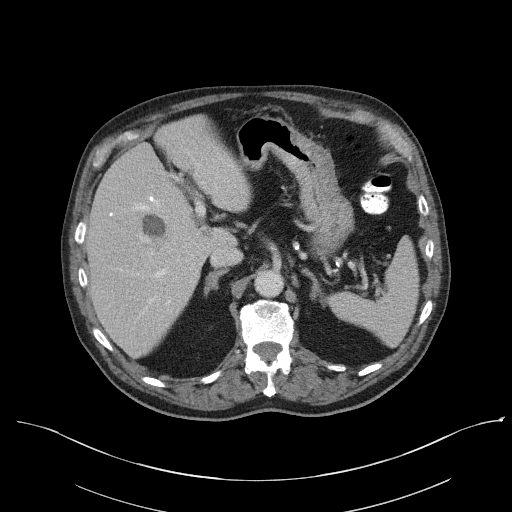
[im 78/83  soft-tissue]
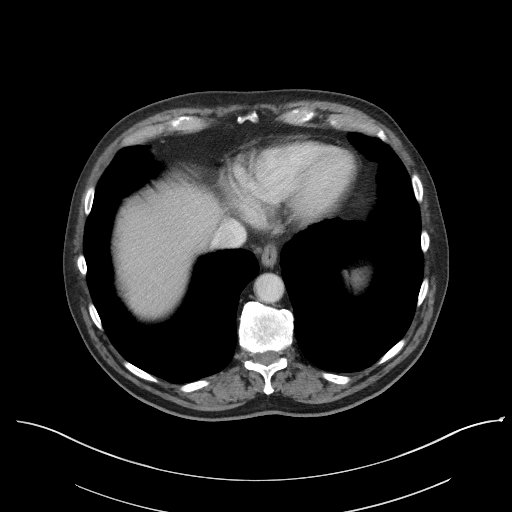

[Series 5: coronal st · coronal · 0.82mm/px · 3 of 107 slices shown]
[im 36/107  soft-tissue]
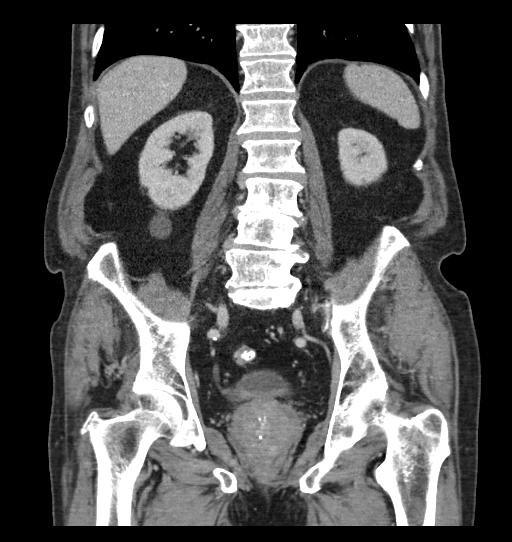
[im 48/107  soft-tissue]
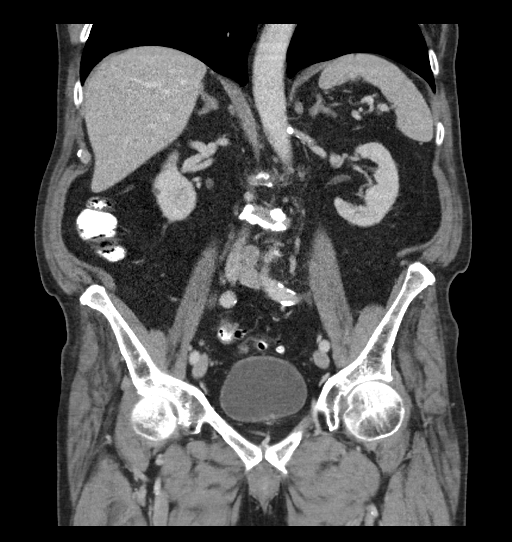
[im 59/107  soft-tissue]
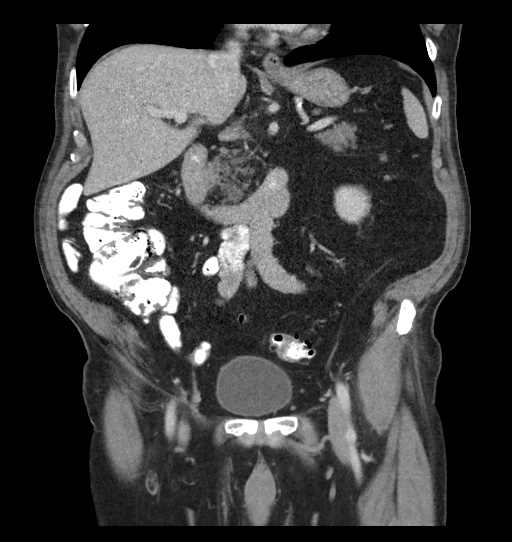

[16 of 46 positions shown; findings below may reference images not displayed]

FINDINGS: Lower Chest: No acute findings.

Hepatobiliary: No hepatic masses identified. Small cyst noted in the
anterior right hepatic lobe. Gallbladder is unremarkable. No
evidence of biliary ductal dilatation.

Pancreas:  No mass or inflammatory changes.

Spleen: Within normal limits in size and appearance.

Adrenals/Urinary Tract: No masses identified. Several small renal
cysts are noted. No evidence of ureteral calculi or hydronephrosis.

Stomach/Bowel: Short-segment circumferential wall thickening is seen
in the hepatic flexure of the colon (e.g. Image [DATE]), highly
suspicious for colon carcinoma. No evidence of bowel obstruction.

Normal appendix visualized. Diverticulosis is seen mainly involving
the sigmoid colon, however there is no evidence of diverticulitis.

Vascular/Lymphatic: Several small pericolonic lymph nodes are seen
adjacent to the hepatic flexure which measure up to 6 mm. No
pathologically enlarged lymph nodes are identified. No acute
vascular findings. Aortic atherosclerotic calcification noted.

Reproductive: Mild to moderately enlarged prostate gland is seen
with mass effect on the bladder base.

Other:  None.

Musculoskeletal:  No suspicious bone lesions identified.
IMPRESSION: Short-segment circumferential colonic wall thickening in the hepatic
flexure, highly suspicious for colon carcinoma. Recommend
colonoscopy for further evaluation.

Tiny sub-cm pericolonic lymph nodes adjacent to the hepatic flexure
are nonspecific, however local lymph node metastases cannot be
excluded.

No evidence of distant metastatic disease.

Sigmoid diverticulosis, without radiographic evidence of
diverticulitis.

Enlarged prostate gland.

Aortic Atherosclerosis ([id number]).

These results will be called to the ordering clinician or
representative by the Radiologist Assistant, and communication
documented in the PACS or [REDACTED].

## 2023-01-23 DIAGNOSIS — H524 Presbyopia: Secondary | ICD-10-CM | POA: Diagnosis not present

## 2023-01-23 DIAGNOSIS — Z9849 Cataract extraction status, unspecified eye: Secondary | ICD-10-CM | POA: Diagnosis not present

## 2023-01-23 DIAGNOSIS — H353132 Nonexudative age-related macular degeneration, bilateral, intermediate dry stage: Secondary | ICD-10-CM | POA: Diagnosis not present

## 2023-01-23 DIAGNOSIS — Z961 Presence of intraocular lens: Secondary | ICD-10-CM | POA: Diagnosis not present

## 2023-01-23 DIAGNOSIS — H52223 Regular astigmatism, bilateral: Secondary | ICD-10-CM | POA: Diagnosis not present

## 2023-01-23 DIAGNOSIS — H5203 Hypermetropia, bilateral: Secondary | ICD-10-CM | POA: Diagnosis not present

## 2023-01-23 DIAGNOSIS — H3530 Unspecified macular degeneration: Secondary | ICD-10-CM | POA: Diagnosis not present

## 2023-01-29 DIAGNOSIS — K7689 Other specified diseases of liver: Secondary | ICD-10-CM | POA: Diagnosis not present

## 2023-01-29 DIAGNOSIS — N281 Cyst of kidney, acquired: Secondary | ICD-10-CM | POA: Diagnosis not present

## 2023-01-29 DIAGNOSIS — C182 Malignant neoplasm of ascending colon: Secondary | ICD-10-CM | POA: Diagnosis not present

## 2023-01-29 DIAGNOSIS — K5730 Diverticulosis of large intestine without perforation or abscess without bleeding: Secondary | ICD-10-CM | POA: Diagnosis not present

## 2023-01-29 LAB — HEPATIC FUNCTION PANEL
ALT: 18 U/L (ref 10–40)
AST: 22 (ref 14–40)
Alkaline Phosphatase: 67 (ref 25–125)
Bilirubin, Total: 0.7

## 2023-01-29 LAB — BASIC METABOLIC PANEL
BUN: 16 (ref 4–21)
CO2: 25 — AB (ref 13–22)
Chloride: 103 (ref 99–108)
Creatinine: 0.9 (ref 0.6–1.3)
EGFR: 60
Glucose: 106
Potassium: 4.5 mEq/L (ref 3.5–5.1)
Sodium: 139 (ref 137–147)

## 2023-01-29 LAB — COMPREHENSIVE METABOLIC PANEL
Albumin: 4.2 (ref 3.5–5.0)
Calcium: 10.5 (ref 8.7–10.7)

## 2023-01-30 NOTE — Progress Notes (Unsigned)
[facility]  [address] [place],  [place]  [address] [phone]  Clinic Day:  01/31/2023  Referring physician: Buckner Malta, MD   HISTORY OF PRESENT ILLNESS:  The patient is a 89 y.o. male with stage IIIB (T3 N1b M0) colon cancer, status post a right hemicolectomy in January 2023.  This was followed by 4 cycles of Xeloda, which he completed in May 2023.  Scans done afterwards showed him to be disease-free.  He comes in today to go over recent scans, which were done due to there being a mild rise in his CEA level.   Since his last visit, the patient has been doing very well.  He denies having abdominal pain, changes in bowel habits, or other GI symptoms which concern him for disease recurrence.  PHYSICAL EXAM:  Blood pressure (!) 168/74, pulse 61, temperature 97.8 F (36.6 C), resp. rate 16, height 5\' 5"  (1.651 m), weight 164 lb 8 oz (74.6 kg), SpO2 92 %. Wt Readings from Last 3 Encounters:  01/31/23 164 lb 8 oz (74.6 kg)  11/19/22 164 lb 6.4 oz (74.6 kg)  07/10/22 161 lb 14.4 oz (73.4 kg)   Body mass index is 27.37 kg/m. Performance status (ECOG): 1 - Symptomatic but completely ambulatory Physical Exam Constitutional:      Appearance: Normal appearance. He is not ill-appearing.  HENT:     Mouth/Throat:     Mouth: Mucous membranes are moist.     Pharynx: Oropharynx is clear. No oropharyngeal exudate or posterior oropharyngeal erythema.  Cardiovascular:     Rate and Rhythm: Normal rate and regular rhythm.     Heart sounds: No murmur heard.    No friction rub. No gallop.  Pulmonary:     Effort: Pulmonary effort is normal. No respiratory distress.     Breath sounds: Normal breath sounds. No wheezing, rhonchi or rales.  Abdominal:     General: Bowel sounds are normal. There is no distension.     Palpations: Abdomen is soft. There is no mass.     Tenderness: There is no abdominal tenderness.  Musculoskeletal:        General: No  swelling.     Right lower leg: No edema.     Left lower leg: No edema.  Lymphadenopathy:     Cervical: No cervical adenopathy.     Upper Body:     Right upper body: No supraclavicular or axillary adenopathy.     Left upper body: No supraclavicular or axillary adenopathy.     Lower Body: No right inguinal adenopathy. No left inguinal adenopathy.  Skin:    General: Skin is warm.     Coloration: Skin is not jaundiced.     Findings: No lesion or rash.  Neurological:     General: No focal deficit present.     Mental Status: He is alert and oriented to person, place, and time. Mental status is at baseline.  Psychiatric:        Mood and Affect: Mood normal.        Behavior: Behavior normal.        Thought Content: Thought content normal.   SCANS:  CT scans of his abdomen/pelvis revealed the following: FINDINGS: Lower chest: Lung bases demonstrate no acute airspace disease or pleural effusion.  Hepatobiliary: Cyst in the right hepatic lobe. No calcified gallstone or biliary dilatation  Pancreas: Unremarkable. No pancreatic ductal dilatation or surrounding inflammatory changes.  Spleen: Normal in size without focal abnormality.  Adrenals/Urinary Tract: Adrenal glands are normal. No hydronephrosis. Cysts and subcentimeter hypodensities in the kidneys for which no imaging follow-up is recommended. The bladder is normal  Stomach/Bowel: Stomach nonenlarged. No dilated small bowel. Small duodenal diverticulum noted. Status post right hemicolectomy and ileocolic anastomosis with patency. No evidence for recurrent mass. Diverticular disease of left colon.  Vascular/Lymphatic: Moderate aortic atherosclerosis. No aneurysm. No suspicious lymph nodes.  Reproductive: Enlarged prostate  Other: Negative for pelvic effusion or free air  Musculoskeletal: No acute or suspicious osseous abnormality  IMPRESSION: 1. Status post right hemicolectomy and ileocolic anastomosis. No CT evidence  for recurrent or metastatic disease. 2. Diverticular disease of left colon without acute inflammatory process. 3. Enlarged prostate. 4. Aortic atherosclerosis.  Aortic Atherosclerosis (ICD10-I70.0). LABS:      Latest Reference Range & Units 07/09/22 09:42 11/06/22 10:05  CEA 0.0 - 4.7 ng/mL 4.7 5.2 (H)  (H): Data is abnormally high  ASSESSMENT & PLAN:  A 89 y.o. male with stage IIIB colon cancer, status post a right hemicolectomy in January 2023, followed by 4 cycles of adjuvant Xeloda, which were completed in May 2023.  In clinic today, I went over all of his CT scan images with him and his family, which they could see hat he radiographically remains disease-free.  Understandably, the patient was pleased with these results. When reviewing his history, it appears that his CEA level has chronically hovered between 4-5.  His most recent CEA of 5.8 is not much different than what it has been previously.  He knows his CEA level will continue to be followed over time to ensure it does not precipitously rise.  If this does happen, the patient understands a PET scan may need to be considered next with respect to his radiographic disease surveillance.  Otherwise, as he is doing well, I will see this patient back in 4 months for repeat clinical assessment.  The patient understands all the plans discussed today and is in agreement with them.  Dynver Clemson Kirby Funk, MD

## 2023-01-31 ENCOUNTER — Other Ambulatory Visit: Payer: Self-pay | Admitting: Oncology

## 2023-01-31 ENCOUNTER — Inpatient Hospital Stay: Payer: Medicare Other | Attending: Oncology | Admitting: Oncology

## 2023-01-31 VITALS — BP 168/74 | HR 61 | Temp 97.8°F | Resp 16 | Ht 65.0 in | Wt 164.5 lb

## 2023-01-31 DIAGNOSIS — C182 Malignant neoplasm of ascending colon: Secondary | ICD-10-CM | POA: Diagnosis not present

## 2023-01-31 LAB — CEA: CEA: 5.8

## 2023-02-07 ENCOUNTER — Encounter: Payer: Self-pay | Admitting: Oncology

## 2023-02-20 DIAGNOSIS — H43813 Vitreous degeneration, bilateral: Secondary | ICD-10-CM | POA: Diagnosis not present

## 2023-02-20 DIAGNOSIS — H353134 Nonexudative age-related macular degeneration, bilateral, advanced atrophic with subfoveal involvement: Secondary | ICD-10-CM | POA: Diagnosis not present

## 2023-02-20 DIAGNOSIS — D3132 Benign neoplasm of left choroid: Secondary | ICD-10-CM | POA: Diagnosis not present

## 2023-02-20 DIAGNOSIS — H31092 Other chorioretinal scars, left eye: Secondary | ICD-10-CM | POA: Diagnosis not present

## 2023-03-13 DIAGNOSIS — H353134 Nonexudative age-related macular degeneration, bilateral, advanced atrophic with subfoveal involvement: Secondary | ICD-10-CM | POA: Diagnosis not present

## 2023-05-08 DIAGNOSIS — H353134 Nonexudative age-related macular degeneration, bilateral, advanced atrophic with subfoveal involvement: Secondary | ICD-10-CM | POA: Diagnosis not present

## 2023-05-08 DIAGNOSIS — D3132 Benign neoplasm of left choroid: Secondary | ICD-10-CM | POA: Diagnosis not present

## 2023-05-08 DIAGNOSIS — H31092 Other chorioretinal scars, left eye: Secondary | ICD-10-CM | POA: Diagnosis not present

## 2023-05-08 DIAGNOSIS — H43813 Vitreous degeneration, bilateral: Secondary | ICD-10-CM | POA: Diagnosis not present

## 2023-05-28 ENCOUNTER — Other Ambulatory Visit: Payer: Medicare Other

## 2023-05-30 ENCOUNTER — Inpatient Hospital Stay: Payer: Medicare Other | Attending: Oncology

## 2023-05-30 DIAGNOSIS — Z08 Encounter for follow-up examination after completed treatment for malignant neoplasm: Secondary | ICD-10-CM | POA: Diagnosis not present

## 2023-05-30 DIAGNOSIS — C182 Malignant neoplasm of ascending colon: Secondary | ICD-10-CM

## 2023-05-30 DIAGNOSIS — Z85038 Personal history of other malignant neoplasm of large intestine: Secondary | ICD-10-CM | POA: Diagnosis not present

## 2023-05-31 LAB — CEA: CEA: 6 ng/mL — ABNORMAL HIGH (ref 0.0–4.7)

## 2023-06-03 NOTE — Progress Notes (Unsigned)
[facility]  [address] [place],  [place]  [address] [phone]  Clinic Day:  01/31/2023  Referring physician: Buckner Malta, MD   HISTORY OF PRESENT ILLNESS:  The patient is a 89 y.o. male with stage IIIB (T3 N1b M0) colon cancer, status post a right hemicolectomy in January 2023.  This was followed by 4 cycles of Xeloda, which he completed in May 2023.  Scans done afterwards showed him to be disease-free.  Of note, recent scans done done when his CEA level was elevated at 5.8 showed no evidence of disease recurrence.    which were done due to there being a mild rise in his CEA level.   Since his last visit, the patient has been doing very well.  He denies having abdominal pain, changes in bowel habits, or other GI symptoms which concern him for disease recurrence.  PHYSICAL EXAM:  There were no vitals taken for this visit. Wt Readings from Last 3 Encounters:  01/31/23 164 lb 8 oz (74.6 kg)  11/19/22 164 lb 6.4 oz (74.6 kg)  07/10/22 161 lb 14.4 oz (73.4 kg)   There is no height or weight on file to calculate BMI. Performance status (ECOG): 1 - Symptomatic but completely ambulatory Physical Exam Constitutional:      Appearance: Normal appearance. He is not ill-appearing.  HENT:     Mouth/Throat:     Mouth: Mucous membranes are moist.     Pharynx: Oropharynx is clear. No oropharyngeal exudate or posterior oropharyngeal erythema.  Cardiovascular:     Rate and Rhythm: Normal rate and regular rhythm.     Heart sounds: No murmur heard.    No friction rub. No gallop.  Pulmonary:     Effort: Pulmonary effort is normal. No respiratory distress.     Breath sounds: Normal breath sounds. No wheezing, rhonchi or rales.  Abdominal:     General: Bowel sounds are normal. There is no distension.     Palpations: Abdomen is soft. There is no mass.     Tenderness: There is no abdominal tenderness.  Musculoskeletal:        General: No swelling.      Right lower leg: No edema.     Left lower leg: No edema.  Lymphadenopathy:     Cervical: No cervical adenopathy.     Upper Body:     Right upper body: No supraclavicular or axillary adenopathy.     Left upper body: No supraclavicular or axillary adenopathy.     Lower Body: No right inguinal adenopathy. No left inguinal adenopathy.  Skin:    General: Skin is warm.     Coloration: Skin is not jaundiced.     Findings: No lesion or rash.  Neurological:     General: No focal deficit present.     Mental Status: He is alert and oriented to person, place, and time. Mental status is at baseline.  Psychiatric:        Mood and Affect: Mood normal.        Behavior: Behavior normal.        Thought Content: Thought content normal.    LABS:    Latest Reference Range & Units 07/09/22 09:42 11/06/22 10:05 01/29/23 00:00 05/30/23 10:20  CEA    5.8 (E)   CEA 0.0 - 4.7 ng/mL 4.7 5.2 (H)  6.0 (H)  (H): Data is abnormally high (E): External lab result  ASSESSMENT & PLAN:  A 89 y.o. male with stage  IIIB colon cancer, status post a right hemicolectomy in January 2023, followed by 4 cycles of adjuvant Xeloda, which were completed in May 2023.  In clinic today, I went over all of his CT scan images with him and his family, which they could see hat he radiographically remains disease-free.  Understandably, the patient was pleased with these results. When reviewing his history, it appears that his CEA level has chronically hovered between 4-5.  His most recent CEA of 5.8 is not much different than what it has been previously.  He knows his CEA level will continue to be followed over time to ensure it does not precipitously rise.  If this does happen, the patient understands a PET scan may need to be considered next with respect to his radiographic disease surveillance.  Otherwise, as he is doing well, I will see this patient back in 4 months for repeat clinical assessment.  The patient understands all the plans  discussed today and is in agreement with them.  Michael Walrath Kirby Funk, MD

## 2023-06-04 ENCOUNTER — Inpatient Hospital Stay (INDEPENDENT_AMBULATORY_CARE_PROVIDER_SITE_OTHER): Payer: Medicare Other | Admitting: Oncology

## 2023-06-04 ENCOUNTER — Other Ambulatory Visit: Payer: Self-pay | Admitting: Oncology

## 2023-06-04 VITALS — BP 160/75 | HR 68 | Temp 98.1°F | Resp 16 | Ht 65.0 in | Wt 161.1 lb

## 2023-06-04 DIAGNOSIS — Z0000 Encounter for general adult medical examination without abnormal findings: Secondary | ICD-10-CM | POA: Diagnosis not present

## 2023-06-04 DIAGNOSIS — C182 Malignant neoplasm of ascending colon: Secondary | ICD-10-CM | POA: Diagnosis not present

## 2023-06-04 DIAGNOSIS — R7302 Impaired glucose tolerance (oral): Secondary | ICD-10-CM | POA: Diagnosis not present

## 2023-06-04 DIAGNOSIS — I480 Paroxysmal atrial fibrillation: Secondary | ICD-10-CM | POA: Diagnosis not present

## 2023-06-04 DIAGNOSIS — E785 Hyperlipidemia, unspecified: Secondary | ICD-10-CM | POA: Diagnosis not present

## 2023-06-04 DIAGNOSIS — Z79899 Other long term (current) drug therapy: Secondary | ICD-10-CM | POA: Diagnosis not present

## 2023-06-04 DIAGNOSIS — I10 Essential (primary) hypertension: Secondary | ICD-10-CM | POA: Diagnosis not present

## 2023-06-04 DIAGNOSIS — C189 Malignant neoplasm of colon, unspecified: Secondary | ICD-10-CM | POA: Diagnosis not present

## 2023-06-04 DIAGNOSIS — Z23 Encounter for immunization: Secondary | ICD-10-CM | POA: Diagnosis not present

## 2023-06-06 ENCOUNTER — Telehealth: Payer: Self-pay | Admitting: Oncology

## 2023-06-06 NOTE — Telephone Encounter (Signed)
Patient has been scheduled. Aware of appt date and time.   Message Header  Department: [facility] [[id number]]   Scheduling Message Entered by Rennis Harding A on 06/04/2023 at  1:12 PM Priority: Routine <No visit type provided>  Department: [facility]  Provider:  Scheduling Notes:  Labs 10-04-23  Appt 10-07-23

## 2023-06-18 DIAGNOSIS — M7989 Other specified soft tissue disorders: Secondary | ICD-10-CM | POA: Diagnosis not present

## 2023-06-19 DIAGNOSIS — M7989 Other specified soft tissue disorders: Secondary | ICD-10-CM | POA: Diagnosis not present

## 2023-06-19 DIAGNOSIS — R2241 Localized swelling, mass and lump, right lower limb: Secondary | ICD-10-CM | POA: Diagnosis not present

## 2023-07-03 DIAGNOSIS — H43813 Vitreous degeneration, bilateral: Secondary | ICD-10-CM | POA: Diagnosis not present

## 2023-07-03 DIAGNOSIS — H31092 Other chorioretinal scars, left eye: Secondary | ICD-10-CM | POA: Diagnosis not present

## 2023-07-03 DIAGNOSIS — H353134 Nonexudative age-related macular degeneration, bilateral, advanced atrophic with subfoveal involvement: Secondary | ICD-10-CM | POA: Diagnosis not present

## 2023-07-03 DIAGNOSIS — D3132 Benign neoplasm of left choroid: Secondary | ICD-10-CM | POA: Diagnosis not present

## 2023-07-03 DIAGNOSIS — D729 Disorder of white blood cells, unspecified: Secondary | ICD-10-CM | POA: Diagnosis not present

## 2023-09-02 DIAGNOSIS — H43813 Vitreous degeneration, bilateral: Secondary | ICD-10-CM | POA: Diagnosis not present

## 2023-09-02 DIAGNOSIS — D3132 Benign neoplasm of left choroid: Secondary | ICD-10-CM | POA: Diagnosis not present

## 2023-09-02 DIAGNOSIS — H31092 Other chorioretinal scars, left eye: Secondary | ICD-10-CM | POA: Diagnosis not present

## 2023-09-02 DIAGNOSIS — H353134 Nonexudative age-related macular degeneration, bilateral, advanced atrophic with subfoveal involvement: Secondary | ICD-10-CM | POA: Diagnosis not present

## 2023-10-03 ENCOUNTER — Other Ambulatory Visit: Payer: Self-pay

## 2023-10-03 DIAGNOSIS — C182 Malignant neoplasm of ascending colon: Secondary | ICD-10-CM

## 2023-10-04 ENCOUNTER — Inpatient Hospital Stay: Payer: Medicare Other | Attending: Oncology

## 2023-10-04 DIAGNOSIS — R970 Elevated carcinoembryonic antigen [CEA]: Secondary | ICD-10-CM | POA: Diagnosis not present

## 2023-10-04 DIAGNOSIS — Z85038 Personal history of other malignant neoplasm of large intestine: Secondary | ICD-10-CM | POA: Diagnosis not present

## 2023-10-04 DIAGNOSIS — C182 Malignant neoplasm of ascending colon: Secondary | ICD-10-CM

## 2023-10-04 LAB — CBC WITH DIFFERENTIAL (CANCER CENTER ONLY)
Abs Immature Granulocytes: 0.02 10*3/uL (ref 0.00–0.07)
Basophils Absolute: 0 10*3/uL (ref 0.0–0.1)
Basophils Relative: 1 %
Eosinophils Absolute: 0.1 10*3/uL (ref 0.0–0.5)
Eosinophils Relative: 1 %
HCT: 48 % (ref 39.0–52.0)
Hemoglobin: 15.8 g/dL (ref 13.0–17.0)
Immature Granulocytes: 0 %
Lymphocytes Relative: 19 %
Lymphs Abs: 1.3 10*3/uL (ref 0.7–4.0)
MCH: 29.3 pg (ref 26.0–34.0)
MCHC: 32.9 g/dL (ref 30.0–36.0)
MCV: 88.9 fL (ref 80.0–100.0)
Monocytes Absolute: 0.6 10*3/uL (ref 0.1–1.0)
Monocytes Relative: 9 %
Neutro Abs: 5 10*3/uL (ref 1.7–7.7)
Neutrophils Relative %: 70 %
Platelet Count: 207 10*3/uL (ref 150–400)
RBC: 5.4 MIL/uL (ref 4.22–5.81)
RDW: 14.2 % (ref 11.5–15.5)
WBC Count: 7.1 10*3/uL (ref 4.0–10.5)
nRBC: 0 % (ref 0.0–0.2)
nRBC: 0 /100{WBCs}

## 2023-10-04 LAB — CEA (ACCESS): CEA (CHCC): 7.24 ng/mL — ABNORMAL HIGH (ref 0.00–5.00)

## 2023-10-06 NOTE — Progress Notes (Unsigned)
 [facility]  [address] [place],  [place]  [address] [phone]  Clinic Day:  10/07/2023  Referring physician: Buckner Malta, MD   HISTORY OF PRESENT ILLNESS:  The patient is a 89 y.o. male with stage IIIB (T3 N1b M0) colon cancer, status post a right hemicolectomy in January 2023.  This was followed by 4 cycles of Xeloda, which he completed in May 2023.  Of note, CT scans done in June 2024 when his CEA level was elevated at 5.8 showed no evidence of disease recurrence.  He comes in today for routine follow-up.  Since his last visit, the patient has been doing very well.  He denies having abdominal pain, changes in bowel habits, or other GI symptoms which concern him for disease recurrence.  PHYSICAL EXAM:  Blood pressure (!) 152/68, pulse 65, temperature 98.3 F (36.8 C), temperature source Oral, resp. rate 18, height 5\' 5"  (1.651 m), weight 167 lb 11.2 oz (76.1 kg), SpO2 98%. Wt Readings from Last 3 Encounters:  10/07/23 167 lb 11.2 oz (76.1 kg)  06/04/23 161 lb 1.6 oz (73.1 kg)  01/31/23 164 lb 8 oz (74.6 kg)   Body mass index is 27.91 kg/m. Performance status (ECOG): 1 - Symptomatic but completely ambulatory Physical Exam Constitutional:      Appearance: Normal appearance. He is not ill-appearing.  HENT:     Mouth/Throat:     Mouth: Mucous membranes are moist.     Pharynx: Oropharynx is clear. No oropharyngeal exudate or posterior oropharyngeal erythema.  Cardiovascular:     Rate and Rhythm: Normal rate and regular rhythm.     Heart sounds: No murmur heard.    No friction rub. No gallop.  Pulmonary:     Effort: Pulmonary effort is normal. No respiratory distress.     Breath sounds: Normal breath sounds. No wheezing, rhonchi or rales.  Abdominal:     General: Bowel sounds are normal. There is no distension.     Palpations: Abdomen is soft. There is no mass.     Tenderness: There is no abdominal tenderness.  Musculoskeletal:         General: No swelling.     Right lower leg: No edema.     Left lower leg: No edema.  Lymphadenopathy:     Cervical: No cervical adenopathy.     Upper Body:     Right upper body: No supraclavicular or axillary adenopathy.     Left upper body: No supraclavicular or axillary adenopathy.     Lower Body: No right inguinal adenopathy. No left inguinal adenopathy.  Skin:    General: Skin is warm.     Coloration: Skin is not jaundiced.     Findings: No lesion or rash.  Neurological:     General: No focal deficit present.     Mental Status: He is alert and oriented to person, place, and time. Mental status is at baseline.  Psychiatric:        Mood and Affect: Mood normal.        Behavior: Behavior normal.        Thought Content: Thought content normal.    LABS:    Latest Reference Range & Units 11/06/22 10:05 01/29/23 00:00 05/30/23 10:20 10/04/23 09:59  CEA   5.8 (E)    CEA 0.0 - 4.7 ng/mL 5.2 (H)  6.0 (H)   CEA ([facility]) 0.00 - 5.00 ng/mL    7.24 (H)  (H): Data is abnormally high (E): External  lab result  ASSESSMENT & PLAN:  A 89 y.o. male with stage IIIB colon cancer, status post a right hemicolectomy in January 2023, followed by 4 cycles of adjuvant Xeloda, which were completed in May 2023.  Once again, his CEA has risen to higher level.  As mentioned previously, scans done when his CEA level was normal at 5.8 showed no evidence of disease recurrence.  As his CEA level continues to rise, a PET scan will be ordered next week to check for occult disease recurrence/metastasis.  This scan will be done on March 3rd.  I will see him back the following day to go over the images and their implications.  The patient understands all the plans discussed today and is in agreement with them.  Laural Eiland Kirby Funk, MD

## 2023-10-07 ENCOUNTER — Other Ambulatory Visit: Payer: Self-pay | Admitting: Oncology

## 2023-10-07 ENCOUNTER — Inpatient Hospital Stay (HOSPITAL_BASED_OUTPATIENT_CLINIC_OR_DEPARTMENT_OTHER): Payer: Medicare Other | Admitting: Oncology

## 2023-10-07 VITALS — BP 152/68 | HR 65 | Temp 98.3°F | Resp 18 | Ht 65.0 in | Wt 167.7 lb

## 2023-10-07 DIAGNOSIS — R970 Elevated carcinoembryonic antigen [CEA]: Secondary | ICD-10-CM | POA: Diagnosis not present

## 2023-10-07 DIAGNOSIS — C182 Malignant neoplasm of ascending colon: Secondary | ICD-10-CM

## 2023-10-07 DIAGNOSIS — Z85038 Personal history of other malignant neoplasm of large intestine: Secondary | ICD-10-CM | POA: Diagnosis not present

## 2023-10-14 ENCOUNTER — Ambulatory Visit (INDEPENDENT_AMBULATORY_CARE_PROVIDER_SITE_OTHER)
Admission: RE | Admit: 2023-10-14 | Discharge: 2023-10-14 | Disposition: A | Discharge status: Home / Self Care | Payer: Medicare Other | Source: Ambulatory Visit | Attending: Oncology | Admitting: Oncology

## 2023-10-14 DIAGNOSIS — C189 Malignant neoplasm of colon, unspecified: Secondary | ICD-10-CM | POA: Diagnosis not present

## 2023-10-14 DIAGNOSIS — C182 Malignant neoplasm of ascending colon: Secondary | ICD-10-CM

## 2023-10-14 DIAGNOSIS — R932 Abnormal findings on diagnostic imaging of liver and biliary tract: Secondary | ICD-10-CM | POA: Diagnosis not present

## 2023-10-14 NOTE — Progress Notes (Unsigned)
 [facility]  [address] [place],  [place]  [address] [phone]  Clinic Day:  10/15/2023  Referring physician: Buckner Malta, MD   HISTORY OF PRESENT ILLNESS:  The patient is a 89 y.o. male with stage IIIB (T3 N1b M0) colon cancer, status post a right hemicolectomy in January 2023.  This was followed by 4 cycles of Xeloda, which he completed in May 2023.  Of note, CT scans done in June 2024 when his CEA level was elevated at 5.8 showed no evidence of disease recurrence.  However, as his CEA level rose even higher to 7.24 at his last visit, a PET scan was ordered to see if occult disease recurrence could be identified.  He comes in today to go over his PET scan images and their implications.  Since his last visit, the patient has been doing well.  He continues to deny having any GI symptoms which concern him for overt signs of recurrent colon cancer.  PHYSICAL EXAM:  Blood pressure (!) 150/74, pulse 68, temperature 98.4 F (36.9 C), temperature source Oral, resp. rate 14, height 5\' 5"  (1.651 m), weight 166 lb 12.8 oz (75.7 kg), SpO2 100%. Wt Readings from Last 3 Encounters:  10/15/23 166 lb 12.8 oz (75.7 kg)  10/07/23 167 lb 11.2 oz (76.1 kg)  06/04/23 161 lb 1.6 oz (73.1 kg)   Body mass index is 27.76 kg/m. Performance status (ECOG): 1 - Symptomatic but completely ambulatory Physical Exam Constitutional:      Appearance: Normal appearance. He is not ill-appearing.  HENT:     Mouth/Throat:     Mouth: Mucous membranes are moist.     Pharynx: Oropharynx is clear. No oropharyngeal exudate or posterior oropharyngeal erythema.  Cardiovascular:     Rate and Rhythm: Normal rate and regular rhythm.     Heart sounds: No murmur heard.    No friction rub. No gallop.  Pulmonary:     Effort: Pulmonary effort is normal. No respiratory distress.     Breath sounds: Normal breath sounds. No wheezing, rhonchi or rales.  Abdominal:     General: Bowel  sounds are normal. There is no distension.     Palpations: Abdomen is soft. There is no mass.     Tenderness: There is no abdominal tenderness.  Musculoskeletal:        General: No swelling.     Right lower leg: No edema.     Left lower leg: No edema.  Lymphadenopathy:     Cervical: No cervical adenopathy.     Upper Body:     Right upper body: No supraclavicular or axillary adenopathy.     Left upper body: No supraclavicular or axillary adenopathy.     Lower Body: No right inguinal adenopathy. No left inguinal adenopathy.  Skin:    General: Skin is warm.     Coloration: Skin is not jaundiced.     Findings: No lesion or rash.  Neurological:     General: No focal deficit present.     Mental Status: He is alert and oriented to person, place, and time. Mental status is at baseline.  Psychiatric:        Mood and Affect: Mood normal.        Behavior: Behavior normal.        Thought Content: Thought content normal.   SCANS: His PET scan done on 10-14-23 revealed the following: FINDINGS: NECK: No hypermetabolic lymph nodes in the neck.   Incidental CT findings:  None.   CHEST: No hypermetabolic mediastinal or hilar nodes. No suspicious pulmonary nodules on the CT scan.   Incidental CT findings: None.   ABDOMEN/PELVIS: Low-density lesion central liver unchanged with simple fluid attenuation. No evidence of hepatic metastasis.   Hypermetabolic nodule within the RIGHT upper quadrant transverse mesocolon measures 13 mm (image 144/4) with SUV max equal 8.7.   Second nodule within the peritoneal space of the RIGHT upper pelvis adjacent to the RIGHT psoas muscle measures 12 mm on image 188 SUV max equal 4.8.   Postsurgical anatomy consistent with RIGHT hemicolectomy.   Incidental CT findings: None.   SKELETON: No focal hypermetabolic activity to suggest skeletal metastasis.   Incidental CT findings: None.   IMPRESSION: 1. Two hypermetabolic peritoneal nodules in the transverse  mesocolon and RIGHT upper pelvis consistent with peritoneal carcinomatosis. 2. No evidence of visceral metastasis or skeletal metastasis. 3. RIGHT hemicolectomy.  LABS:    Latest Reference Range & Units 11/06/22 10:05 01/29/23 00:00 05/30/23 10:20 10/04/23 09:59  CEA   5.8 (E)    CEA 0.0 - 4.7 ng/mL 5.2 (H)  6.0 (H)   CEA ([facility]) 0.00 - 5.00 ng/mL    7.24 (H)  (H): Data is abnormally high (E): External lab result  ASSESSMENT & PLAN:  A 89 y.o. male with early signs of metastatic colon cancer.  As mentioned previously, he was diagnosed with stage IIIB colon cancer, status post a right hemicolectomy in January 2023, followed by 4 cycles of adjuvant Xeloda, which were completed in May 2023.  In clinic today, I went over his PET scan images with him, for which he could see the very small, but metastatic, areas of peritoneal deposits within his abdomen and pelvis.  The patient understands that he likely has incurable disease.  Moving forward, the plan would be to do whatever necessary to keep his disease under control without his daily quality of life being impacted.  I also told the patient that, at 89 years old, if he chooses not to undergo any form of therapy while he is asymptomatic, I would not disagree with such thinking.  I will have his previous colon cancer tissue, as well as his peripheral blood from today, sent to [email or web address] to determine if any type of targeted therapy could be used to treat his disease.  I will see him back in 3 weeks to go over these results, which will be used to formulate his next course of action.  The patient understands all the plans discussed today and is in agreement with them.  Chimene Salo Kirby Funk, MD

## 2023-10-15 ENCOUNTER — Inpatient Hospital Stay: Payer: Medicare Other | Attending: Oncology | Admitting: Oncology

## 2023-10-15 ENCOUNTER — Inpatient Hospital Stay

## 2023-10-15 ENCOUNTER — Telehealth: Payer: Self-pay | Admitting: Oncology

## 2023-10-15 VITALS — BP 150/74 | HR 68 | Temp 98.4°F | Resp 14 | Ht 65.0 in | Wt 166.8 lb

## 2023-10-15 DIAGNOSIS — Z9049 Acquired absence of other specified parts of digestive tract: Secondary | ICD-10-CM | POA: Insufficient documentation

## 2023-10-15 DIAGNOSIS — C182 Malignant neoplasm of ascending colon: Secondary | ICD-10-CM

## 2023-10-15 DIAGNOSIS — Z85038 Personal history of other malignant neoplasm of large intestine: Secondary | ICD-10-CM | POA: Diagnosis not present

## 2023-10-15 DIAGNOSIS — C189 Malignant neoplasm of colon, unspecified: Secondary | ICD-10-CM | POA: Diagnosis not present

## 2023-10-15 MED ORDER — FLUDEOXYGLUCOSE F - 18 (FDG) INJECTION
8.5700 | Freq: Once | INTRAVENOUS | Status: AC | PRN
  Administered 2023-10-14: 8.57 via INTRAVENOUS

## 2023-10-15 NOTE — Telephone Encounter (Signed)
 Patient has been scheduled for follow-up visit per 10/15/23 LOS.  Pt given an appt calendar with date and time.

## 2023-10-29 DIAGNOSIS — H43813 Vitreous degeneration, bilateral: Secondary | ICD-10-CM | POA: Diagnosis not present

## 2023-10-29 DIAGNOSIS — H353134 Nonexudative age-related macular degeneration, bilateral, advanced atrophic with subfoveal involvement: Secondary | ICD-10-CM | POA: Diagnosis not present

## 2023-10-29 DIAGNOSIS — H31092 Other chorioretinal scars, left eye: Secondary | ICD-10-CM | POA: Diagnosis not present

## 2023-10-29 DIAGNOSIS — D3132 Benign neoplasm of left choroid: Secondary | ICD-10-CM | POA: Diagnosis not present

## 2023-11-04 NOTE — Progress Notes (Unsigned)
 [facility]  [address] [place],  [place]  [address] [phone]  Clinic Day:  10/15/2023  Referring physician: Buckner Malta, MD   HISTORY OF PRESENT ILLNESS:  The patient is a 89 y.o. male with stage IIIB (T3 N1b M0) colon cancer, status post a right hemicolectomy in January 2023.  This was followed by 4 cycles of Xeloda, which he completed in May 2023.  Of note, CT scans done in June 2024 when his CEA level was elevated at 5.8 showed no evidence of disease recurrence.  However, as his CEA level rose even higher to 7.24 at his last visit, a PET scan was ordered to see if occult disease recurrence could be identified.  He comes in today to go over his PET scan images and their implications.  Since his last visit, the patient has been doing well.  He continues to deny having any GI symptoms which concern him for overt signs of recurrent colon cancer.  [id number] RESULTS  PHYSICAL EXAM:  There were no vitals taken for this visit. Wt Readings from Last 3 Encounters:  10/15/23 166 lb 12.8 oz (75.7 kg)  10/07/23 167 lb 11.2 oz (76.1 kg)  06/04/23 161 lb 1.6 oz (73.1 kg)   There is no height or weight on file to calculate BMI. Performance status (ECOG): 1 - Symptomatic but completely ambulatory Physical Exam Constitutional:      Appearance: Normal appearance. He is not ill-appearing.  HENT:     Mouth/Throat:     Mouth: Mucous membranes are moist.     Pharynx: Oropharynx is clear. No oropharyngeal exudate or posterior oropharyngeal erythema.  Cardiovascular:     Rate and Rhythm: Normal rate and regular rhythm.     Heart sounds: No murmur heard.    No friction rub. No gallop.  Pulmonary:     Effort: Pulmonary effort is normal. No respiratory distress.     Breath sounds: Normal breath sounds. No wheezing, rhonchi or rales.  Abdominal:     General: Bowel sounds are normal. There is no distension.     Palpations: Abdomen is soft. There is no  mass.     Tenderness: There is no abdominal tenderness.  Musculoskeletal:        General: No swelling.     Right lower leg: No edema.     Left lower leg: No edema.  Lymphadenopathy:     Cervical: No cervical adenopathy.     Upper Body:     Right upper body: No supraclavicular or axillary adenopathy.     Left upper body: No supraclavicular or axillary adenopathy.     Lower Body: No right inguinal adenopathy. No left inguinal adenopathy.  Skin:    General: Skin is warm.     Coloration: Skin is not jaundiced.     Findings: No lesion or rash.  Neurological:     General: No focal deficit present.     Mental Status: He is alert and oriented to person, place, and time. Mental status is at baseline.  Psychiatric:        Mood and Affect: Mood normal.        Behavior: Behavior normal.        Thought Content: Thought content normal.  SCANS: His PET scan done on 10-14-23 revealed the following: FINDINGS: NECK: No hypermetabolic lymph nodes in the neck.   Incidental CT findings: None.   CHEST: No hypermetabolic mediastinal or hilar nodes. No suspicious pulmonary nodules on the  CT scan.   Incidental CT findings: None.   ABDOMEN/PELVIS: Low-density lesion central liver unchanged with simple fluid attenuation. No evidence of hepatic metastasis.   Hypermetabolic nodule within the RIGHT upper quadrant transverse mesocolon measures 13 mm (image 144/4) with SUV max equal 8.7.   Second nodule within the peritoneal space of the RIGHT upper pelvis adjacent to the RIGHT psoas muscle measures 12 mm on image 188 SUV max equal 4.8.   Postsurgical anatomy consistent with RIGHT hemicolectomy.   Incidental CT findings: None.   SKELETON: No focal hypermetabolic activity to suggest skeletal metastasis.   Incidental CT findings: None.   IMPRESSION: 1. Two hypermetabolic peritoneal nodules in the transverse mesocolon and RIGHT upper pelvis consistent with peritoneal carcinomatosis. 2. No evidence  of visceral metastasis or skeletal metastasis. 3. RIGHT hemicolectomy.  LABS:    Latest Reference Range & Units 11/06/22 10:05 01/29/23 00:00 05/30/23 10:20 10/04/23 09:59  CEA   5.8 (E)    CEA 0.0 - 4.7 ng/mL 5.2 (H)  6.0 (H)   CEA ([facility]) 0.00 - 5.00 ng/mL    7.24 (H)  (H): Data is abnormally high (E): External lab result  ASSESSMENT & PLAN:  A 89 y.o. male with early signs of metastatic colon cancer.  As mentioned previously, he was diagnosed with stage IIIB colon cancer, status post a right hemicolectomy in January 2023, followed by 4 cycles of adjuvant Xeloda, which were completed in May 2023.  In clinic today, I went over his PET scan images with him, for which he could see the very small, but metastatic, areas of peritoneal deposits within his abdomen and pelvis.  The patient understands that he likely has incurable disease.  Moving forward, the plan would be to do whatever necessary to keep his disease under control without his daily quality of life being impacted.  I also told the patient that, at 89 years old, if he chooses not to undergo any form of therapy while he is asymptomatic, I would not disagree with such thinking.  I will have his previous colon cancer tissue, as well as his peripheral blood from today, sent to [email or web address] to determine if any type of targeted therapy could be used to treat his disease.  I will see him back in 3 weeks to go over these results, which will be used to formulate his next course of action.  The patient understands all the plans discussed today and is in agreement with them.  Cadience Bradfield Kirby Funk, MD

## 2023-11-05 ENCOUNTER — Inpatient Hospital Stay (HOSPITAL_BASED_OUTPATIENT_CLINIC_OR_DEPARTMENT_OTHER): Admitting: Oncology

## 2023-11-05 ENCOUNTER — Other Ambulatory Visit: Payer: Self-pay | Admitting: Oncology

## 2023-11-05 VITALS — BP 155/64 | HR 58 | Temp 98.0°F | Resp 16 | Ht 65.0 in | Wt 168.0 lb

## 2023-11-05 DIAGNOSIS — Z85038 Personal history of other malignant neoplasm of large intestine: Secondary | ICD-10-CM | POA: Diagnosis not present

## 2023-11-05 DIAGNOSIS — Z9049 Acquired absence of other specified parts of digestive tract: Secondary | ICD-10-CM | POA: Diagnosis not present

## 2023-11-05 DIAGNOSIS — C184 Malignant neoplasm of transverse colon: Secondary | ICD-10-CM

## 2023-11-11 ENCOUNTER — Other Ambulatory Visit: Payer: Self-pay | Admitting: Oncology

## 2023-11-11 MED ORDER — CAPECITABINE 500 MG PO TABS: 1500.0000 mg | ORAL_TABLET | Freq: Two times a day (BID) | ORAL | 5 refills | Status: DC

## 2023-11-12 ENCOUNTER — Telehealth: Payer: Self-pay | Admitting: Pharmacy Technician

## 2023-11-12 ENCOUNTER — Other Ambulatory Visit (HOSPITAL_COMMUNITY): Payer: Self-pay

## 2023-11-12 ENCOUNTER — Inpatient Hospital Stay: Attending: Oncology

## 2023-11-12 ENCOUNTER — Telehealth: Payer: Self-pay

## 2023-11-12 ENCOUNTER — Other Ambulatory Visit: Payer: Self-pay

## 2023-11-12 DIAGNOSIS — C786 Secondary malignant neoplasm of retroperitoneum and peritoneum: Secondary | ICD-10-CM | POA: Diagnosis not present

## 2023-11-12 DIAGNOSIS — Z85038 Personal history of other malignant neoplasm of large intestine: Secondary | ICD-10-CM | POA: Insufficient documentation

## 2023-11-12 DIAGNOSIS — C787 Secondary malignant neoplasm of liver and intrahepatic bile duct: Secondary | ICD-10-CM | POA: Diagnosis not present

## 2023-11-12 DIAGNOSIS — C184 Malignant neoplasm of transverse colon: Secondary | ICD-10-CM

## 2023-11-12 LAB — CMP (CANCER CENTER ONLY)
ALT: 8 U/L (ref 0–44)
AST: 15 U/L (ref 15–41)
Albumin: 4.2 g/dL (ref 3.5–5.0)
Alkaline Phosphatase: 75 U/L (ref 38–126)
Anion gap: 11 (ref 5–15)
BUN: 16 mg/dL (ref 8–23)
CO2: 23 mmol/L (ref 22–32)
Calcium: 9.9 mg/dL (ref 8.9–10.3)
Chloride: 105 mmol/L (ref 98–111)
Creatinine: 1.25 mg/dL — ABNORMAL HIGH (ref 0.61–1.24)
GFR, Estimated: 53 mL/min — ABNORMAL LOW (ref >60)
Glucose, Bld: 110 mg/dL — ABNORMAL HIGH (ref 70–99)
Potassium: 3.8 mmol/L (ref 3.5–5.1)
Sodium: 139 mmol/L (ref 135–145)
Total Bilirubin: 0.3 mg/dL (ref 0.0–1.2)
Total Protein: 7.1 g/dL (ref 6.5–8.1)

## 2023-11-12 LAB — CBC WITH DIFFERENTIAL (CANCER CENTER ONLY)
Abs Immature Granulocytes: 0.01 10*3/uL (ref 0.00–0.07)
Basophils Absolute: 0.1 10*3/uL (ref 0.0–0.1)
Basophils Relative: 1 %
Eosinophils Absolute: 0.1 10*3/uL (ref 0.0–0.5)
Eosinophils Relative: 2 %
HCT: 44.8 % (ref 39.0–52.0)
Hemoglobin: 15.3 g/dL (ref 13.0–17.0)
Immature Granulocytes: 0 %
Lymphocytes Relative: 19 %
Lymphs Abs: 1.5 10*3/uL (ref 0.7–4.0)
MCH: 30 pg (ref 26.0–34.0)
MCHC: 34.2 g/dL (ref 30.0–36.0)
MCV: 87.8 fL (ref 80.0–100.0)
Monocytes Absolute: 0.7 10*3/uL (ref 0.1–1.0)
Monocytes Relative: 9 %
Neutro Abs: 5.3 10*3/uL (ref 1.7–7.7)
Neutrophils Relative %: 69 %
Platelet Count: 200 10*3/uL (ref 150–400)
RBC: 5.1 MIL/uL (ref 4.22–5.81)
RDW: 14.1 % (ref 11.5–15.5)
WBC Count: 7.8 10*3/uL (ref 4.0–10.5)
nRBC: 0 % (ref 0.0–0.2)
nRBC: 0 /100{WBCs}

## 2023-11-12 LAB — CEA (ACCESS): CEA (CHCC): 8.68 ng/mL — ABNORMAL HIGH (ref 0.00–5.00)

## 2023-11-12 NOTE — Telephone Encounter (Signed)
 Oral Oncology Pharmacist Encounter  Received new prescription for Xeloda (capecitabine) for the treatment of metastatic colon cancer, planned duration until disease progression or unacceptable toxicity. Patient has been on xeloda previously ~2 years ago.  Patient will need new labs prior to starting. Patient is going into clinic today at 3:30 to get new labs.  Current medication list in Epic reviewed, DDIs with Xeloda identified: - omeprazole (cat C): proton pump inhibitors may diminish the therapeutic effect of xeloda. Will discuss use with patient.   Evaluated chart and no patient barriers to medication adherence noted.   Prescription has been e-scribed to the [facility] for benefits analysis and approval.  Oral Oncology Clinic will continue to follow for insurance authorization, copayment issues, initial counseling and start date.  Bethel Born, PharmD Hematology/Oncology Clinical Pharmacist [facility] Oral Chemotherapy Navigation Clinic [phone] 11/12/2023 9:06 AM

## 2023-11-12 NOTE — Telephone Encounter (Signed)
 Oral Oncology Patient Advocate Encounter  After completing a benefits investigation, prior authorization for capecitabine is not required at this time through his Medicare Part B.  Patient's copay is $0.    Sherilyn Dacosta, CPhT Specialty Pharmacy Patient Advocate Phone: [phone] Fax: [phone]

## 2023-11-13 ENCOUNTER — Telehealth: Payer: Self-pay

## 2023-11-13 NOTE — Telephone Encounter (Signed)
 Pt's dtr called to report her dad had been working all day in the yard the day of lab draw. He hadn't drank any fluids or very little. She thinks this is why his Cr is elevated. I sent above message to [facility] and Dr Melvyn Neth.

## 2023-11-14 ENCOUNTER — Other Ambulatory Visit: Payer: Self-pay | Admitting: Pharmacy Technician

## 2023-11-14 ENCOUNTER — Other Ambulatory Visit: Payer: Self-pay

## 2023-11-14 ENCOUNTER — Other Ambulatory Visit (HOSPITAL_COMMUNITY): Payer: Self-pay

## 2023-11-14 MED ORDER — CAPECITABINE 500 MG PO TABS
ORAL_TABLET | ORAL | 5 refills | Status: DC
  Filled 2023-11-14: qty 70, 21d supply, fill #0
  Filled 2023-12-02: qty 70, 21d supply, fill #1
  Filled 2023-12-19: qty 70, 21d supply, fill #2
  Filled 2024-01-13: qty 70, 21d supply, fill #3
  Filled 2024-02-04: qty 70, 21d supply, fill #4
  Filled 2024-02-19 – 2024-02-26 (×4): qty 70, 21d supply, fill #5

## 2023-11-14 NOTE — Progress Notes (Signed)
 Specialty Pharmacy Initial Fill Coordination Note  Jorge Lawrence is a 89 y.o. male contacted today regarding refills of specialty medication(s) Capecitabine (XELODA) .  Patient requested Delivery  on 11/18/23  to verified address [address]   [place] [address]   Medication will be filled on 11/15/23.   Patient is aware of $0 copayment.

## 2023-11-14 NOTE — Progress Notes (Signed)
 Patient counseled on xeloda in telephone encounter opened on 11/12/2023.  Bethel Born, PharmD Hematology/Oncology Clinical Pharmacist [facility] Oral Chemotherapy Navigation Clinic [phone]

## 2023-11-14 NOTE — Telephone Encounter (Signed)
 Oral Chemotherapy Pharmacist Encounter  I spoke with patients daughter for overview of: Xeloda (capecitabine) for the  treatment of metastatic colon cancer, planned duration until disease progression or unacceptable drug toxicity.   Counseled patient on administration, dosing, side effects, monitoring, drug-food interactions, safe handling, storage, and disposal.  Patient will take Xeloda 500mg  tablets, 3 tablets (1500mg ) by mouth in AM and 2 tabs (1000mg ) by mouth in PM, within 30 minutes of finishing meals, for 14 days on, 7 days off, repeated every 21 days.  Xeloda start date: 11/19/2023  Adverse effects include but are not limited to: fatigue, decreased blood counts, GI upset, diarrhea, mouth sores, and hand-foot syndrome. Patient has been on therapy before and is aware of side effects and the only side effect he had was some changes on his hand but did put on the lotion multiple times per day. Daughter states that she will order the lotion again today so that he uses it before he starts taking the medication.   Patient has anti-emetic on hand and knows to take it if nausea develops. Patient will obtain anti diarrheal and alert the office of 4 or more loose stools above baseline.   Latest Reference Range & Units 11/12/23 15:30  Sodium 135 - 145 mmol/L 139  Potassium 3.5 - 5.1 mmol/L 3.8  Chloride 98 - 111 mmol/L 105  CO2 22 - 32 mmol/L 23  Glucose 70 - 99 mg/dL 564 (H)  BUN 8 - 23 mg/dL 16  Creatinine 3.32 - 9.51 mg/dL 8.84 (H)  Calcium 8.9 - 10.3 mg/dL 9.9  Anion gap 5 - 15  11  Alkaline Phosphatase 38 - 126 U/L 75  Albumin 3.5 - 5.0 g/dL 4.2  AST 15 - 41 U/L 15  ALT 0 - 44 U/L 8  Total Protein 6.5 - 8.1 g/dL 7.1  Total Bilirubin 0.0 - 1.2 mg/dL 0.3    Latest Reference Range & Units 11/12/23 15:30  WBC 4.0 - 10.5 K/uL 7.8  RBC 4.22 - 5.81 MIL/uL 5.10  Hemoglobin 13.0 - 17.0 g/dL 16.6  HCT 06.3 - 01.6 % 44.8  MCV 80.0 - 100.0 fL 87.8  MCH 26.0 - 34.0 pg 30.0  MCHC 30.0 - 36.0  g/dL 01.0  RDW 93.2 - 35.5 % 14.1  Platelets 150 - 400 K/uL 200   Discussed patients lab results with Dr. Melvyn Neth and due to creatinine clearance patient has been dose reduced to the 3 tablets in the AM and 2 tablets in the PM. Prescription dose and frequency assessed for appropriateness. Script was modified with Dr. Melvyn Neth co-signature requested.   Reviewed with patient importance of keeping a medication schedule and plan for any missed doses. No barriers to medication adherence identified.  Medication reconciliation performed and medication/allergy list. Patient rarely takes the omeprazole and will hold off on taking it during the 14 days of xeloda.   All questions answered. Patients daughter voiced understanding and appreciation. Medication education handout placed in mail for patient. Patient knows to call the office with questions or concerns. Oral Chemotherapy Clinic phone number provided to patient.   Bethel Born, PharmD Hematology/Oncology Clinical Pharmacist [facility]otherapy [facility] [phone]

## 2023-11-15 ENCOUNTER — Other Ambulatory Visit: Payer: Self-pay

## 2023-11-22 ENCOUNTER — Other Ambulatory Visit: Payer: Self-pay

## 2023-11-26 ENCOUNTER — Other Ambulatory Visit: Payer: Self-pay

## 2023-11-26 ENCOUNTER — Telehealth: Payer: Self-pay

## 2023-11-26 NOTE — Telephone Encounter (Signed)
 I called to see how pt is doing since starting the Xeloda. His daughter, Sallyanne Creamer, states she is out of town, but every time she talks to him he tells her he is doing fine. She mentioned she did purchase the cream to apply to his hands and feet, like he used the last time he took this medication. She was very appreciative of the call and I told her to please call us  if they have any concerns or if he develops fever.

## 2023-12-02 ENCOUNTER — Other Ambulatory Visit: Payer: Self-pay | Admitting: Pharmacy Technician

## 2023-12-02 ENCOUNTER — Other Ambulatory Visit: Payer: Self-pay

## 2023-12-02 NOTE — Progress Notes (Signed)
 Specialty Pharmacy Refill Coordination Note  Jorge Lawrence is a 89 y.o. male contacted today regarding refills of specialty medication(s) Capecitabine  (XELODA )    Patient requested (Patient-Rptd) Delivery   Delivery date: (Patient-Rptd) 12/05/23   Verified address: (Patient-Rptd) [address]  [place], [place]  [address]   Medication will be filled on 12/04/23.

## 2023-12-03 ENCOUNTER — Other Ambulatory Visit: Payer: Self-pay

## 2023-12-03 DIAGNOSIS — Z1339 Encounter for screening examination for other mental health and behavioral disorders: Secondary | ICD-10-CM | POA: Diagnosis not present

## 2023-12-03 DIAGNOSIS — C799 Secondary malignant neoplasm of unspecified site: Secondary | ICD-10-CM | POA: Diagnosis not present

## 2023-12-03 DIAGNOSIS — C189 Malignant neoplasm of colon, unspecified: Secondary | ICD-10-CM | POA: Diagnosis not present

## 2023-12-03 DIAGNOSIS — I10 Essential (primary) hypertension: Secondary | ICD-10-CM | POA: Diagnosis not present

## 2023-12-04 ENCOUNTER — Inpatient Hospital Stay

## 2023-12-04 ENCOUNTER — Other Ambulatory Visit: Payer: Self-pay

## 2023-12-04 VITALS — BP 146/68 | HR 66 | Temp 97.8°F | Resp 16 | Wt 164.5 lb

## 2023-12-04 DIAGNOSIS — C787 Secondary malignant neoplasm of liver and intrahepatic bile duct: Secondary | ICD-10-CM | POA: Diagnosis not present

## 2023-12-04 DIAGNOSIS — Z85038 Personal history of other malignant neoplasm of large intestine: Secondary | ICD-10-CM | POA: Diagnosis not present

## 2023-12-04 DIAGNOSIS — C184 Malignant neoplasm of transverse colon: Secondary | ICD-10-CM

## 2023-12-04 DIAGNOSIS — C786 Secondary malignant neoplasm of retroperitoneum and peritoneum: Secondary | ICD-10-CM | POA: Diagnosis not present

## 2023-12-04 LAB — CBC WITH DIFFERENTIAL (CANCER CENTER ONLY)
Abs Immature Granulocytes: 0.01 10*3/uL (ref 0.00–0.07)
Basophils Absolute: 0.1 10*3/uL (ref 0.0–0.1)
Basophils Relative: 1 %
Eosinophils Absolute: 0 10*3/uL (ref 0.0–0.5)
Eosinophils Relative: 0 %
HCT: 45.4 % (ref 39.0–52.0)
Hemoglobin: 15.5 g/dL (ref 13.0–17.0)
Immature Granulocytes: 0 %
Lymphocytes Relative: 14 %
Lymphs Abs: 1 10*3/uL (ref 0.7–4.0)
MCH: 30 pg (ref 26.0–34.0)
MCHC: 34.1 g/dL (ref 30.0–36.0)
MCV: 87.8 fL (ref 80.0–100.0)
Monocytes Absolute: 0.6 10*3/uL (ref 0.1–1.0)
Monocytes Relative: 8 %
Neutro Abs: 5.7 10*3/uL (ref 1.7–7.7)
Neutrophils Relative %: 77 %
Platelet Count: 221 10*3/uL (ref 150–400)
RBC: 5.17 MIL/uL (ref 4.22–5.81)
RDW: 14.4 % (ref 11.5–15.5)
WBC Count: 7.3 10*3/uL (ref 4.0–10.5)
nRBC: 0 % (ref 0.0–0.2)
nRBC: 0 /100{WBCs}

## 2023-12-04 LAB — CMP (CANCER CENTER ONLY)
ALT: <5 U/L (ref 0–44)
AST: 20 U/L (ref 15–41)
Albumin: 3.9 g/dL (ref 3.5–5.0)
Alkaline Phosphatase: 74 U/L (ref 38–126)
Anion gap: 12 (ref 5–15)
BUN: 16 mg/dL (ref 8–23)
CO2: 23 mmol/L (ref 22–32)
Calcium: 10.3 mg/dL (ref 8.9–10.3)
Chloride: 102 mmol/L (ref 98–111)
Creatinine: 1.07 mg/dL (ref 0.61–1.24)
GFR, Estimated: >60 mL/min (ref >60)
Glucose, Bld: 157 mg/dL — ABNORMAL HIGH (ref 70–99)
Potassium: 3.9 mmol/L (ref 3.5–5.1)
Sodium: 137 mmol/L (ref 135–145)
Total Bilirubin: 0.6 mg/dL (ref 0.0–1.2)
Total Protein: 7.2 g/dL (ref 6.5–8.1)

## 2023-12-04 NOTE — Progress Notes (Signed)
   Current therapy: Xeloda  1500mg  in AM with food and 100mg  in PM with food for 14 days then holding for 7 days  Weight decreased by 4.5 lbs; bp yesterday in office was 132/66. No side effects   Will get shipment from [facility] specialty pharmacy 4/24.        Latest Reference Range & Units 11/12/23 15:30 12/04/23 08:20  Sodium 135 - 145 mmol/L 139 137  Potassium 3.5 - 5.1 mmol/L 3.8 3.9  Chloride 98 - 111 mmol/L 105 102  CO2 22 - 32 mmol/L 23 23  Glucose 70 - 99 mg/dL 347 (H) 425 (H)  BUN 8 - 23 mg/dL 16 16  Creatinine 9.56 - 1.24 mg/dL 3.87 (H) 5.64  Calcium 8.9 - 10.3 mg/dL 9.9 33.2  Anion gap 5 - 15  11 12   Alkaline Phosphatase 38 - 126 U/L 75 74  Albumin 3.5 - 5.0 g/dL 4.2 3.9  AST 15 - 41 U/L 15 20  ALT 0 - 44 U/L 8 <5  Total Protein 6.5 - 8.1 g/dL 7.1 7.2  Total Bilirubin 0.0 - 1.2 mg/dL 0.3 0.6    Latest Reference Range & Units 11/12/23 15:30 12/04/23 08:20  WBC 4.0 - 10.5 K/uL 7.8 7.3  RBC 4.22 - 5.81 MIL/uL 5.10 5.17  Hemoglobin 13.0 - 17.0 g/dL 95.1 88.4  HCT 16.6 - 06.3 % 44.8 45.4  MCV 80.0 - 100.0 fL 87.8 87.8  MCH 26.0 - 34.0 pg 30.0 30.0  MCHC 30.0 - 36.0 g/dL 01.6 01.0  RDW 93.2 - 35.5 % 14.1 14.4  Platelets 150 - 400 K/uL 200 221   Plan:  Patient will start cycle 2 day 1 on   Travez Stancil, PharmD Hematology/Oncology Clinical Pharmacist [facility]otherapy [facility] [phone]

## 2023-12-12 ENCOUNTER — Telehealth: Payer: Self-pay

## 2023-12-12 ENCOUNTER — Other Ambulatory Visit: Payer: Self-pay

## 2023-12-12 DIAGNOSIS — C189 Malignant neoplasm of colon, unspecified: Secondary | ICD-10-CM

## 2023-12-12 DIAGNOSIS — Z7969 Long term (current) use of other immunomodulators and immunosuppressants: Secondary | ICD-10-CM

## 2023-12-12 NOTE — Telephone Encounter (Signed)
 Xeloda  500mg  2 tabs in am, 2 tabs in pm for 14d, then off 7 days.

## 2023-12-13 ENCOUNTER — Other Ambulatory Visit: Payer: Self-pay

## 2023-12-13 NOTE — Telephone Encounter (Signed)
 I spoke with Jorge Lawrence. He states the only thing he can really tell is that he gets tired quicker than before. He said Jorge Lawrence, his daughter, is in [place]  visiting her daughter. He is taking the Xeloda  3 tabs with breakfast, and 2 tabs with evening meal. No missed doses. He denies N/V, skin rash, itching, constipation, diarrhea, fever, and chills. I told him if he were to develop a temp of 100.4 or higher, he needs to call us  day or night. He said, "I'll have [facility] to call you". I told him that was fine.

## 2023-12-16 NOTE — Progress Notes (Unsigned)
 [facility]  [address] [place],  [place]  [address] [phone]  Clinic Day:  11/05/2023  Referring physician: Harvest Lineman, MD   HISTORY OF PRESENT ILLNESS:  The patient is a 89 y.o. male who was recently diagnosed to have metastatic colon cancer, which includes spread of disease to his liver and peritoneum.  He has been taking single-agent Xeloda  in the palliative setting.    With respect to his colon cancer history, he was diagnosed with stage IIIB (T3 N1b M0) colon cancer, status post a right hemicolectomy in January 2023.  This was followed by 4 cycles of Xeloda , which he completed in May 2023.  Of note, CT scans done in June 2024 when his CEA level was elevated at 5.8 showed no evidence of disease recurrence.  However, as his CEA level rose even higher to 7.24 at his last visit, a PET scan was done, which revealed his metastatic disease.    PHYSICAL EXAM:  There were no vitals taken for this visit. Wt Readings from Last 3 Encounters:  12/04/23 164 lb 8 oz (74.6 kg)  11/05/23 168 lb (76.2 kg)  10/15/23 166 lb 12.8 oz (75.7 kg)   There is no height or weight on file to calculate BMI. Performance status (ECOG): 1 - Symptomatic but completely ambulatory Physical Exam Constitutional:      Appearance: Normal appearance. He is not ill-appearing.  HENT:     Mouth/Throat:     Mouth: Mucous membranes are moist.     Pharynx: Oropharynx is clear. No oropharyngeal exudate or posterior oropharyngeal erythema.  Cardiovascular:     Rate and Rhythm: Normal rate and regular rhythm.     Heart sounds: No murmur heard.    No friction rub. No gallop.  Pulmonary:     Effort: Pulmonary effort is normal. No respiratory distress.     Breath sounds: Normal breath sounds. No wheezing, rhonchi or rales.  Abdominal:     General: Bowel sounds are normal. There is no distension.     Palpations: Abdomen is soft. There is no mass.     Tenderness: There is no  abdominal tenderness.  Musculoskeletal:        General: No swelling.     Right lower leg: No edema.     Left lower leg: No edema.  Lymphadenopathy:     Cervical: No cervical adenopathy.     Upper Body:     Right upper body: No supraclavicular or axillary adenopathy.     Left upper body: No supraclavicular or axillary adenopathy.     Lower Body: No right inguinal adenopathy. No left inguinal adenopathy.  Skin:    General: Skin is warm.     Coloration: Skin is not jaundiced.     Findings: No lesion or rash.  Neurological:     General: No focal deficit present.     Mental Status: He is alert and oriented to person, place, and time. Mental status is at baseline.  Psychiatric:        Mood and Affect: Mood normal.        Behavior: Behavior normal.        Thought Content: Thought content normal.    PATHOLOGY:  His Guardant testing did not reveal any mutations/alterations.  His disease also came back microsatellite stable.   LABS:    Latest Reference Range & Units 11/06/22 10:05 01/29/23 00:00 05/30/23 10:20 10/04/23 09:59  CEA   5.8 (E)    CEA  0.0 - 4.7 ng/mL 5.2 (H)  6.0 (H)   CEA ([facility]) 0.00 - 5.00 ng/mL    7.24 (H)  (H): Data is abnormally high (E): External lab result  ASSESSMENT & PLAN:  A 89 y.o. male with metastatic colon cancer.   In clinic today, I went over his Guardant360 testing, which unfortunately showed he had no mutations/alterations that would predispose him to targeted therapy.  Furthermore, his disease was microsatellite stable, thus making him ineligible for immunotherapy.  Based upon these results, we discussed what options to consider for his palliative disease management moving forward.  The options include single-agent Xeloda , XELOX +/-Avastin, FOLFOX +/-Avastin, or observation only.  After mulling over his options, the patient is leaning towards using single-agent Xeloda , which he has been exposed to previously.  However, as his daily quality of life is good and  he will be followed very closely, the patient and his family know that any evidence of disease progression on single-agent Xeloda  would lead to an escalation of his palliative treatment plan, if he chooses to do this.  The patient wishes to talk to his entire family before coming to a concrete decision about his palliative treatment plan.  He knows to inform our office as soon as the decision has been made.  Otherwise, I will see this patient back in 6 weeks for repeat clinical assessment.  The patient understands all the plans discussed today and is in agreement with them.  Tayvien Kane Felicia Horde, MD

## 2023-12-17 ENCOUNTER — Inpatient Hospital Stay: Attending: Oncology | Admitting: Oncology

## 2023-12-17 ENCOUNTER — Other Ambulatory Visit: Payer: Self-pay | Admitting: Oncology

## 2023-12-17 ENCOUNTER — Inpatient Hospital Stay

## 2023-12-17 VITALS — BP 149/66 | HR 60 | Temp 97.9°F | Resp 16 | Ht 65.0 in | Wt 162.5 lb

## 2023-12-17 DIAGNOSIS — C182 Malignant neoplasm of ascending colon: Secondary | ICD-10-CM | POA: Diagnosis not present

## 2023-12-17 DIAGNOSIS — C787 Secondary malignant neoplasm of liver and intrahepatic bile duct: Secondary | ICD-10-CM | POA: Insufficient documentation

## 2023-12-17 DIAGNOSIS — C188 Malignant neoplasm of overlapping sites of colon: Secondary | ICD-10-CM

## 2023-12-17 DIAGNOSIS — C189 Malignant neoplasm of colon, unspecified: Secondary | ICD-10-CM

## 2023-12-17 DIAGNOSIS — C786 Secondary malignant neoplasm of retroperitoneum and peritoneum: Secondary | ICD-10-CM | POA: Diagnosis not present

## 2023-12-17 DIAGNOSIS — Z79899 Other long term (current) drug therapy: Secondary | ICD-10-CM | POA: Diagnosis not present

## 2023-12-17 DIAGNOSIS — Z7969 Long term (current) use of other immunomodulators and immunosuppressants: Secondary | ICD-10-CM

## 2023-12-17 LAB — CBC WITH DIFFERENTIAL (CANCER CENTER ONLY)
Abs Immature Granulocytes: 0.01 10*3/uL (ref 0.00–0.07)
Basophils Absolute: 0.1 10*3/uL (ref 0.0–0.1)
Basophils Relative: 1 %
Eosinophils Absolute: 0.1 10*3/uL (ref 0.0–0.5)
Eosinophils Relative: 1 %
HCT: 44.3 % (ref 39.0–52.0)
Hemoglobin: 15.1 g/dL (ref 13.0–17.0)
Immature Granulocytes: 0 %
Lymphocytes Relative: 18 %
Lymphs Abs: 1.3 10*3/uL (ref 0.7–4.0)
MCH: 30.5 pg (ref 26.0–34.0)
MCHC: 34.1 g/dL (ref 30.0–36.0)
MCV: 89.5 fL (ref 80.0–100.0)
Monocytes Absolute: 0.6 10*3/uL (ref 0.1–1.0)
Monocytes Relative: 8 %
Neutro Abs: 4.9 10*3/uL (ref 1.7–7.7)
Neutrophils Relative %: 72 %
Platelet Count: 175 10*3/uL (ref 150–400)
RBC: 4.95 MIL/uL (ref 4.22–5.81)
RDW: 15.5 % (ref 11.5–15.5)
WBC Count: 6.9 10*3/uL (ref 4.0–10.5)
nRBC: 0 % (ref 0.0–0.2)
nRBC: 0 /100{WBCs}

## 2023-12-17 LAB — CMP (CANCER CENTER ONLY)
ALT: 10 U/L (ref 0–44)
AST: 16 U/L (ref 15–41)
Albumin: 4 g/dL (ref 3.5–5.0)
Alkaline Phosphatase: 72 U/L (ref 38–126)
Anion gap: 13 (ref 5–15)
BUN: 16 mg/dL (ref 8–23)
CO2: 23 mmol/L (ref 22–32)
Calcium: 10.3 mg/dL (ref 8.9–10.3)
Chloride: 101 mmol/L (ref 98–111)
Creatinine: 1.06 mg/dL (ref 0.61–1.24)
GFR, Estimated: >60 mL/min (ref >60)
Glucose, Bld: 94 mg/dL (ref 70–99)
Potassium: 4 mmol/L (ref 3.5–5.1)
Sodium: 138 mmol/L (ref 135–145)
Total Bilirubin: 0.6 mg/dL (ref 0.0–1.2)
Total Protein: 7.1 g/dL (ref 6.5–8.1)

## 2023-12-19 ENCOUNTER — Other Ambulatory Visit: Payer: Self-pay

## 2023-12-19 NOTE — Progress Notes (Signed)
 Specialty Pharmacy Refill Coordination Note  Jorge Lawrence is a 89 y.o. male contacted today regarding refills of specialty medication(s) Capecitabine  (XELODA )   Patient requested Delivery   Delivery date: 12/27/23   Verified address: [address]  [place], [place]  [address]   Medication will be filled on 12/26/23.

## 2023-12-19 NOTE — Progress Notes (Signed)
 Specialty Pharmacy Ongoing Clinical Assessment Note  Jorge Lawrence is a 89 y.o. male who is being followed by the specialty pharmacy service for RxSp Oncology   Patient's specialty medication(s) reviewed today: Capecitabine  (XELODA )   Missed doses in the last 4 weeks: 0   Patient/Caregiver did not have any additional questions or concerns.   Therapeutic benefit summary: Patient is achieving benefit   Adverse events/side effects summary: No adverse events/side effects   Patient's therapy is appropriate to: Continue    Goals Addressed             This Visit's Progress    Maintain optimal adherence to therapy       Patient is on track. Patient will maintain adherence          Follow up: 3 months  Jorge Lawrence M Holden Draughon Specialty Pharmacist

## 2023-12-20 ENCOUNTER — Telehealth: Payer: Self-pay

## 2023-12-20 NOTE — Telephone Encounter (Signed)
 I spoke with pt's daughter. She said her dad is doing good, no issues. No missed doses. They saw Dr Harles Lied in clinic this week and everything looking good. She verbalized appreciation for f/u call.

## 2023-12-24 DIAGNOSIS — H31092 Other chorioretinal scars, left eye: Secondary | ICD-10-CM | POA: Diagnosis not present

## 2023-12-24 DIAGNOSIS — D3132 Benign neoplasm of left choroid: Secondary | ICD-10-CM | POA: Diagnosis not present

## 2023-12-24 DIAGNOSIS — H43813 Vitreous degeneration, bilateral: Secondary | ICD-10-CM | POA: Diagnosis not present

## 2023-12-24 DIAGNOSIS — H353134 Nonexudative age-related macular degeneration, bilateral, advanced atrophic with subfoveal involvement: Secondary | ICD-10-CM | POA: Diagnosis not present

## 2024-01-07 ENCOUNTER — Telehealth: Payer: Self-pay

## 2024-01-07 NOTE — Telephone Encounter (Signed)
 I spoke with Jorge Lawrence. He states he is doing well. He went to [employer] with his daughter this past weekend. He is taking 3 Xeloda  in the morning and 2 Xeloda  in the afternoon. He denies N/V, fever, chills, cough, SOB, and hands and feet peeling/redness. He has diarrhea once in a while. No missed doses.

## 2024-01-09 ENCOUNTER — Other Ambulatory Visit (HOSPITAL_COMMUNITY): Payer: Self-pay

## 2024-01-13 ENCOUNTER — Other Ambulatory Visit: Payer: Self-pay

## 2024-01-13 LAB — GUARDANT 360

## 2024-01-13 NOTE — Progress Notes (Signed)
 Specialty Pharmacy Refill Coordination Note  Jorge Lawrence is a 89 y.o. male contacted today regarding refills of specialty medication(s) Capecitabine  (XELODA )   Patient requested Delivery   Delivery date: 01/17/24   Verified address: [address]  [place], [place]  [address]   Medication will be filled on 01/16/24.

## 2024-02-04 ENCOUNTER — Other Ambulatory Visit: Payer: Self-pay

## 2024-02-04 NOTE — Progress Notes (Signed)
 Specialty Pharmacy Refill Coordination Note  Jorge Lawrence is a 89 y.o. male contacted today regarding refills of specialty medication(s) Capecitabine  (XELODA )   Patient requested Delivery   Delivery date: 02/06/24   Verified address: [address]  [place] [address]   Medication will be filled on 02/05/24.

## 2024-02-06 ENCOUNTER — Other Ambulatory Visit: Payer: Self-pay | Admitting: Oncology

## 2024-02-06 ENCOUNTER — Ambulatory Visit (HOSPITAL_BASED_OUTPATIENT_CLINIC_OR_DEPARTMENT_OTHER)
Admission: RE | Admit: 2024-02-06 | Discharge: 2024-02-06 | Disposition: A | Discharge status: Home / Self Care | Source: Ambulatory Visit | Attending: Oncology | Admitting: Oncology

## 2024-02-06 ENCOUNTER — Inpatient Hospital Stay: Attending: Oncology

## 2024-02-06 ENCOUNTER — Other Ambulatory Visit (HOSPITAL_BASED_OUTPATIENT_CLINIC_OR_DEPARTMENT_OTHER): Admitting: Radiology

## 2024-02-06 ENCOUNTER — Other Ambulatory Visit: Payer: Self-pay

## 2024-02-06 ENCOUNTER — Encounter: Payer: Self-pay | Admitting: Oncology

## 2024-02-06 DIAGNOSIS — C188 Malignant neoplasm of overlapping sites of colon: Secondary | ICD-10-CM

## 2024-02-06 DIAGNOSIS — C182 Malignant neoplasm of ascending colon: Secondary | ICD-10-CM | POA: Diagnosis not present

## 2024-02-06 DIAGNOSIS — M8589 Other specified disorders of bone density and structure, multiple sites: Secondary | ICD-10-CM | POA: Diagnosis not present

## 2024-02-06 DIAGNOSIS — C189 Malignant neoplasm of colon, unspecified: Secondary | ICD-10-CM | POA: Diagnosis not present

## 2024-02-06 LAB — CBC WITH DIFFERENTIAL (CANCER CENTER ONLY)
Abs Immature Granulocytes: 0.02 10*3/uL (ref 0.00–0.07)
Basophils Absolute: 0 10*3/uL (ref 0.0–0.1)
Basophils Relative: 1 %
Eosinophils Absolute: 0.1 10*3/uL (ref 0.0–0.5)
Eosinophils Relative: 1 %
HCT: 44.4 % (ref 39.0–52.0)
Hemoglobin: 15.2 g/dL (ref 13.0–17.0)
Immature Granulocytes: 0 %
Lymphocytes Relative: 18 %
Lymphs Abs: 1.4 10*3/uL (ref 0.7–4.0)
MCH: 32.7 pg (ref 26.0–34.0)
MCHC: 34.2 g/dL (ref 30.0–36.0)
MCV: 95.5 fL (ref 80.0–100.0)
Monocytes Absolute: 0.8 10*3/uL (ref 0.1–1.0)
Monocytes Relative: 10 %
Neutro Abs: 5.5 10*3/uL (ref 1.7–7.7)
Neutrophils Relative %: 70 %
Platelet Count: 202 10*3/uL (ref 150–400)
RBC: 4.65 MIL/uL (ref 4.22–5.81)
RDW: 19 % — ABNORMAL HIGH (ref 11.5–15.5)
WBC Count: 7.8 10*3/uL (ref 4.0–10.5)
nRBC: 0 % (ref 0.0–0.2)

## 2024-02-06 LAB — CMP (CANCER CENTER ONLY)
ALT: 11 U/L (ref 0–44)
AST: 17 U/L (ref 15–41)
Albumin: 4.2 g/dL (ref 3.5–5.0)
Alkaline Phosphatase: 81 U/L (ref 38–126)
Anion gap: 11 (ref 5–15)
BUN: 16 mg/dL (ref 8–23)
CO2: 26 mmol/L (ref 22–32)
Calcium: 10.6 mg/dL — ABNORMAL HIGH (ref 8.9–10.3)
Chloride: 101 mmol/L (ref 98–111)
Creatinine: 1.12 mg/dL (ref 0.61–1.24)
GFR, Estimated: >60 mL/min (ref >60)
Glucose, Bld: 104 mg/dL — ABNORMAL HIGH (ref 70–99)
Potassium: 3.9 mmol/L (ref 3.5–5.1)
Sodium: 138 mmol/L (ref 135–145)
Total Bilirubin: 0.5 mg/dL (ref 0.0–1.2)
Total Protein: 7.3 g/dL (ref 6.5–8.1)

## 2024-02-06 LAB — CEA (ACCESS): CEA (CHCC): 7.78 ng/mL — ABNORMAL HIGH (ref 0.00–5.00)

## 2024-02-06 MED ORDER — IOHEXOL 300 MG/ML  SOLN
100.0000 mL | Freq: Once | INTRAMUSCULAR | Status: AC | PRN
Start: 2024-02-06 — End: 2024-02-06
  Administered 2024-02-06: 100 mL via INTRAVENOUS

## 2024-02-06 NOTE — Progress Notes (Signed)
 [facility]  [address] [place],  [place]  [address] [phone]  Clinic Day:  02/07/2024  Referring physician: Clemmie Nest, MD   HISTORY OF PRESENT ILLNESS:  The patient is a 89 y.o. male with metastatic colon cancer, which includes spread of disease to his liver and peritoneum.  He has been taking single-agent Xeloda  in the palliative setting for disease control.  He comes in today to go over his CT scans done yesterday.  The scans were done as there has been a continuous rise in his CEA level.  Since his last visit, the patient has been doing well.  He denies having any new symptoms or findings was concerning for overt signs of disease progression.  He remains compliant with his oral Xeloda  chemotherapy.  With respect to his colon cancer history, he was diagnosed with stage IIIB (T3 N1b M0) colon cancer, status post a right hemicolectomy in January 2023.  This was followed by 4 cycles of Xeloda , which he completed in May 2023.  Of note, CT scans done in June 2024 when his CEA level was elevated at 5.8 showed no evidence of disease recurrence.  However, as his CEA level rose even higher to 7.24, a PET scan was done, which revealed his metastatic disease.  Ultimately, the patient decided to be placed back on single-agent Xeloda  for his palliative disease management.  PHYSICAL EXAM:  Blood pressure 136/64, pulse 76, temperature 98.1 F (36.7 C), temperature source Oral, resp. rate 16, height 5' 5 (1.651 m), weight 165 lb 9.6 oz (75.1 kg), SpO2 96%. Wt Readings from Last 3 Encounters:  02/07/24 165 lb 9.6 oz (75.1 kg)  12/17/23 162 lb 8 oz (73.7 kg)  12/04/23 164 lb 8 oz (74.6 kg)   Body mass index is 27.56 kg/m. Performance status (ECOG): 1 - Symptomatic but completely ambulatory Physical Exam Constitutional:      Appearance: Normal appearance. He is not ill-appearing.  HENT:     Mouth/Throat:     Mouth: Mucous membranes are moist.      Pharynx: Oropharynx is clear. No oropharyngeal exudate or posterior oropharyngeal erythema.   Cardiovascular:     Rate and Rhythm: Normal rate and regular rhythm.     Heart sounds: No murmur heard.    No friction rub. No gallop.  Pulmonary:     Effort: Pulmonary effort is normal. No respiratory distress.     Breath sounds: Normal breath sounds. No wheezing, rhonchi or rales.  Abdominal:     General: Bowel sounds are normal. There is no distension.     Palpations: Abdomen is soft. There is no mass.     Tenderness: There is no abdominal tenderness.   Musculoskeletal:        General: No swelling.     Right lower leg: No edema.     Left lower leg: No edema.  Lymphadenopathy:     Cervical: No cervical adenopathy.     Upper Body:     Right upper body: No supraclavicular or axillary adenopathy.     Left upper body: No supraclavicular or axillary adenopathy.     Lower Body: No right inguinal adenopathy. No left inguinal adenopathy.   Skin:    General: Skin is warm.     Coloration: Skin is not jaundiced.     Findings: No lesion or rash.   Neurological:     General: No focal deficit present.     Mental Status: He is alert and oriented  to person, place, and time. Mental status is at baseline.   Psychiatric:        Mood and Affect: Mood normal.        Behavior: Behavior normal.        Thought Content: Thought content normal.    PATHOLOGY:  His Guardant testing did not reveal any mutations/alterations.  His disease also came back microsatellite stable.   SCANS:  CT scans of his abdomen/pelvis done on 02/06/2024 revealed the following: FINDINGS: Lower chest: The visualized lung bases are clear. There is coronary vascular calcification.   No intra-abdominal free air or free fluid.   Hepatobiliary: A 2 cm cyst in the right lobe of the liver. No biliary dilatation. The gallbladder is unremarkable.   Pancreas: Unremarkable. No pancreatic ductal dilatation or surrounding inflammatory  changes.   Spleen: Normal in size without focal abnormality.   Adrenals/Urinary Tract: The adrenal glands unremarkable. There is no hydronephrosis on either side. Small bilateral renal cysts. There is symmetric enhancement and excretion of contrast by both kidneys. The visualized ureters and urinary bladder unremarkable.   Stomach/Bowel: There is sigmoid diverticulosis. There is no bowel obstruction or active inflammation. Postsurgical changes of bowel with ileocolic anastomosis in the right upper quadrant.  Appendectomy.   Vascular/Lymphatic: Mild aortoiliac atherosclerotic disease. The IVC is unremarkable. No portal venous gas. There is no adenopathy.   Reproductive: Enlarged prostate gland measuring 5.5 cm in transverse axial diameter. The seminal vesicles are symmetric.   Other: None   Musculoskeletal: Osteopenia with degenerative changes of the spine.  No acute osseous pathology.   IMPRESSION: 1. No acute intra-abdominal or pelvic pathology. No evidence of metastatic disease in the abdomen or pelvis. 2. Sigmoid diverticulosis. No bowel obstruction. 3. Enlarged prostate gland. 4.  Aortic Atherosclerosis (ICD10-I70.0).   LABS:    Latest Reference Range & Units 10/04/23 09:59 11/12/23 15:30 02/06/24 15:12  CEA ([facility]) 0.00 - 5.00 ng/mL 7.24 (H) 8.68 (H) 7.78 (H)  (H): Data is abnormally high  ASSESSMENT & PLAN:  A 89 y.o. male with metastatic colon cancer.   In clinic today, I went over all of his CT scan images with him, for which he could see that there remains no evidence of disease progression despite the recent increases in his CEA level.  Furthermore, the CEA level he had drawn yesterday trended lower, which is also encouraging.  Although radiology commented on him not having any evidence of metastatic disease, there were 2 areas on his PET scan from a few months ago that lit up, for which I can still see on his current CT scans.  However, they appear to have not changed in  size.  I consider his disease under ideal control.  Based upon this, he will continue taking Xeloda  twice daily, every 2 out of 3 weeks.  As he is clinically doing well, I will see him back in 2 months for repeat clinical assessment.  The patient understands all the plans discussed today and is in agreement with them.    Syretta Kochel DELENA Kerns, MD

## 2024-02-07 ENCOUNTER — Other Ambulatory Visit: Payer: Self-pay | Admitting: Oncology

## 2024-02-07 ENCOUNTER — Inpatient Hospital Stay (HOSPITAL_BASED_OUTPATIENT_CLINIC_OR_DEPARTMENT_OTHER): Admitting: Oncology

## 2024-02-07 VITALS — BP 136/64 | HR 76 | Temp 98.1°F | Resp 16 | Ht 65.0 in | Wt 165.6 lb

## 2024-02-07 DIAGNOSIS — C188 Malignant neoplasm of overlapping sites of colon: Secondary | ICD-10-CM

## 2024-02-07 DIAGNOSIS — M8589 Other specified disorders of bone density and structure, multiple sites: Secondary | ICD-10-CM | POA: Diagnosis not present

## 2024-02-07 DIAGNOSIS — C182 Malignant neoplasm of ascending colon: Secondary | ICD-10-CM

## 2024-02-10 ENCOUNTER — Telehealth: Payer: Self-pay | Admitting: Oncology

## 2024-02-10 NOTE — Telephone Encounter (Signed)
 Patient has been scheduled for follow-up visit per 02/10/24 LOS.

## 2024-02-11 DIAGNOSIS — D3132 Benign neoplasm of left choroid: Secondary | ICD-10-CM | POA: Diagnosis not present

## 2024-02-11 DIAGNOSIS — H43813 Vitreous degeneration, bilateral: Secondary | ICD-10-CM | POA: Diagnosis not present

## 2024-02-11 DIAGNOSIS — H353134 Nonexudative age-related macular degeneration, bilateral, advanced atrophic with subfoveal involvement: Secondary | ICD-10-CM | POA: Diagnosis not present

## 2024-02-11 DIAGNOSIS — H31092 Other chorioretinal scars, left eye: Secondary | ICD-10-CM | POA: Diagnosis not present

## 2024-02-13 ENCOUNTER — Other Ambulatory Visit: Payer: Self-pay

## 2024-02-17 ENCOUNTER — Other Ambulatory Visit (HOSPITAL_COMMUNITY): Payer: Self-pay

## 2024-02-19 ENCOUNTER — Other Ambulatory Visit: Payer: Self-pay

## 2024-02-20 ENCOUNTER — Other Ambulatory Visit: Payer: Self-pay

## 2024-02-24 ENCOUNTER — Other Ambulatory Visit: Payer: Self-pay

## 2024-02-26 ENCOUNTER — Other Ambulatory Visit: Payer: Self-pay | Admitting: Pharmacy Technician

## 2024-02-26 ENCOUNTER — Other Ambulatory Visit: Payer: Self-pay

## 2024-02-26 NOTE — Progress Notes (Signed)
.  Specialty Pharmacy Refill Coordination Note  Jorge Lawrence is a 89 y.o. male contacted today regarding refills of specialty medication(s) Capecitabine  (XELODA )   Patient requested Delivery   Delivery date: 02/27/24   Verified address: [address] [place] [address]   Medication will be filled on 02/26/24.   Spoke to patient's daughter Shona.

## 2024-03-10 ENCOUNTER — Other Ambulatory Visit: Payer: Self-pay

## 2024-03-10 ENCOUNTER — Encounter (INDEPENDENT_AMBULATORY_CARE_PROVIDER_SITE_OTHER): Payer: Self-pay

## 2024-03-10 ENCOUNTER — Other Ambulatory Visit (HOSPITAL_COMMUNITY): Payer: Self-pay

## 2024-03-10 ENCOUNTER — Other Ambulatory Visit: Payer: Self-pay | Admitting: Oncology

## 2024-03-10 DIAGNOSIS — C184 Malignant neoplasm of transverse colon: Secondary | ICD-10-CM

## 2024-03-10 NOTE — Progress Notes (Signed)
 Specialty Pharmacy Refill Coordination Note  Jorge Lawrence is a 89 y.o. male contacted today regarding refills of specialty medication(s) Capecitabine  (XELODA )   Patient requested (Patient-Rptd) Delivery   Delivery date: 03/13/24   Verified address: (Patient-Rptd) [address]  [place], [place]  [address]   Medication will be filled on 03/12/24, pending refill approval.

## 2024-03-12 ENCOUNTER — Other Ambulatory Visit: Payer: Self-pay

## 2024-03-17 ENCOUNTER — Other Ambulatory Visit: Payer: Self-pay | Admitting: Oncology

## 2024-03-17 ENCOUNTER — Other Ambulatory Visit: Payer: Self-pay

## 2024-03-17 DIAGNOSIS — C184 Malignant neoplasm of transverse colon: Secondary | ICD-10-CM

## 2024-03-23 ENCOUNTER — Other Ambulatory Visit: Payer: Self-pay | Admitting: Oncology

## 2024-03-23 ENCOUNTER — Other Ambulatory Visit: Payer: Self-pay | Admitting: Hematology and Oncology

## 2024-03-23 ENCOUNTER — Other Ambulatory Visit: Payer: Self-pay

## 2024-03-23 ENCOUNTER — Other Ambulatory Visit (HOSPITAL_COMMUNITY): Payer: Self-pay

## 2024-03-23 DIAGNOSIS — C184 Malignant neoplasm of transverse colon: Secondary | ICD-10-CM

## 2024-03-23 MED ORDER — CAPECITABINE 500 MG PO TABS
ORAL_TABLET | ORAL | 5 refills | Status: DC
  Filled 2024-03-23: qty 70, 21d supply, fill #0
  Filled 2024-04-06 (×2): qty 70, 21d supply, fill #1
  Filled 2024-04-24: qty 70, 21d supply, fill #2
  Filled 2024-05-14: qty 70, 21d supply, fill #3
  Filled 2024-06-03 – 2024-07-06 (×2): qty 70, 21d supply, fill #4
  Filled 2024-07-20 – 2024-07-22 (×2): qty 70, 21d supply, fill #5

## 2024-03-23 NOTE — Progress Notes (Signed)
 Received call from patient's daughter this morning regarding delivery. Patient is due to start next cycle tomorrow and has no doses left on hand. Pharmacy has sent multiple request to [facility] and have received no response. Sent message to nurse A.Castlebury and pending response. Pharmacy may need to same day to avoid further delays when new rx received.

## 2024-03-24 ENCOUNTER — Other Ambulatory Visit: Payer: Self-pay

## 2024-03-24 ENCOUNTER — Other Ambulatory Visit (HOSPITAL_COMMUNITY): Payer: Self-pay

## 2024-03-24 NOTE — Progress Notes (Signed)
 Specialty Pharmacy Ongoing Clinical Assessment Note  I spoke with the patient's daughter, Jorge Lawrence. Jorge Lawrence is a 89 y.o. male who is being followed by the specialty pharmacy service for RxSp Oncology   Patient's specialty medication(s) reviewed today: Capecitabine  (XELODA )   Missed doses in the last 4 weeks: 0   Patient/Caregiver did not have any additional questions or concerns.   Therapeutic benefit summary: Patient is achieving benefit   Adverse events/side effects summary: No adverse events/side effects   Patient's therapy is appropriate to: Continue    Goals Addressed             This Visit's Progress    Maintain optimal adherence to therapy   On track    Patient is on track. Patient will maintain adherence          Follow up: 3 months  Silvano LOISE Dolly Specialty Pharmacist

## 2024-03-31 DIAGNOSIS — Z9849 Cataract extraction status, unspecified eye: Secondary | ICD-10-CM | POA: Diagnosis not present

## 2024-03-31 DIAGNOSIS — H353132 Nonexudative age-related macular degeneration, bilateral, intermediate dry stage: Secondary | ICD-10-CM | POA: Diagnosis not present

## 2024-03-31 DIAGNOSIS — H5203 Hypermetropia, bilateral: Secondary | ICD-10-CM | POA: Diagnosis not present

## 2024-03-31 DIAGNOSIS — H52223 Regular astigmatism, bilateral: Secondary | ICD-10-CM | POA: Diagnosis not present

## 2024-03-31 DIAGNOSIS — Z961 Presence of intraocular lens: Secondary | ICD-10-CM | POA: Diagnosis not present

## 2024-03-31 DIAGNOSIS — H3122 Choroidal dystrophy (central areolar) (generalized) (peripapillary): Secondary | ICD-10-CM | POA: Diagnosis not present

## 2024-03-31 DIAGNOSIS — H524 Presbyopia: Secondary | ICD-10-CM | POA: Diagnosis not present

## 2024-03-31 DIAGNOSIS — H3530 Unspecified macular degeneration: Secondary | ICD-10-CM | POA: Diagnosis not present

## 2024-04-03 ENCOUNTER — Encounter (INDEPENDENT_AMBULATORY_CARE_PROVIDER_SITE_OTHER): Payer: Self-pay

## 2024-04-06 ENCOUNTER — Other Ambulatory Visit: Payer: Self-pay

## 2024-04-06 NOTE — Progress Notes (Signed)
 Spoke with patients daughter Shona, that due to the upcoming Labor Day Holiday medication needed to be rescheduled. Daughter requested a new delivery date of 04/09/24

## 2024-04-06 NOTE — Progress Notes (Signed)
 Specialty Pharmacy Refill Coordination Note  Jorge Lawrence is a 89 y.o. male contacted today regarding refills of specialty medication(s) Capecitabine  (XELODA )   Patient requested Delivery   Delivery date: 04/14/24   Verified address: [address]  [place], [place]  [address]   Medication will be filled on 04/10/24.

## 2024-04-07 ENCOUNTER — Other Ambulatory Visit: Payer: Self-pay

## 2024-04-07 ENCOUNTER — Inpatient Hospital Stay: Attending: Oncology

## 2024-04-07 DIAGNOSIS — C182 Malignant neoplasm of ascending colon: Secondary | ICD-10-CM | POA: Diagnosis not present

## 2024-04-07 DIAGNOSIS — C787 Secondary malignant neoplasm of liver and intrahepatic bile duct: Secondary | ICD-10-CM | POA: Insufficient documentation

## 2024-04-07 DIAGNOSIS — H43813 Vitreous degeneration, bilateral: Secondary | ICD-10-CM | POA: Diagnosis not present

## 2024-04-07 DIAGNOSIS — H31092 Other chorioretinal scars, left eye: Secondary | ICD-10-CM | POA: Diagnosis not present

## 2024-04-07 DIAGNOSIS — C184 Malignant neoplasm of transverse colon: Secondary | ICD-10-CM

## 2024-04-07 DIAGNOSIS — D3132 Benign neoplasm of left choroid: Secondary | ICD-10-CM | POA: Diagnosis not present

## 2024-04-07 DIAGNOSIS — Z79899 Other long term (current) drug therapy: Secondary | ICD-10-CM | POA: Diagnosis not present

## 2024-04-07 DIAGNOSIS — C786 Secondary malignant neoplasm of retroperitoneum and peritoneum: Secondary | ICD-10-CM | POA: Diagnosis not present

## 2024-04-07 DIAGNOSIS — H353134 Nonexudative age-related macular degeneration, bilateral, advanced atrophic with subfoveal involvement: Secondary | ICD-10-CM | POA: Diagnosis not present

## 2024-04-07 LAB — CEA (ACCESS): CEA (CHCC): 7.73 ng/mL — ABNORMAL HIGH (ref 0.00–5.00)

## 2024-04-07 NOTE — Progress Notes (Unsigned)
 [facility]  [address] [place],  [place]  [address] [phone]  Clinic Day:  02/07/2024  Referring physician: Clemmie Nest, MD   HISTORY OF PRESENT ILLNESS:  The patient is a 89 y.o. male with metastatic colon cancer, which includes spread of disease to his liver and peritoneum.  He has been taking single-agent Xeloda  in the palliative setting for disease control.  He comes in today to go over his CT scans done yesterday.  The scans were done as there has been a continuous rise in his CEA level.  Since his last visit, the patient has been doing well.  He denies having any new symptoms or findings was concerning for overt signs of disease progression.  He remains compliant with his oral Xeloda  chemotherapy.  With respect to his colon cancer history, he was diagnosed with stage IIIB (T3 N1b M0) colon cancer, status post a right hemicolectomy in January 2023.  This was followed by 4 cycles of Xeloda , which he completed in May 2023.  Of note, CT scans done in June 2024 when his CEA level was elevated at 5.8 showed no evidence of disease recurrence.  However, as his CEA level rose even higher to 7.24, a PET scan was done, which revealed his metastatic disease.  Ultimately, the patient decided to be placed back on single-agent Xeloda  for his palliative disease management.  PHYSICAL EXAM:  There were no vitals taken for this visit. Wt Readings from Last 3 Encounters:  02/07/24 165 lb 9.6 oz (75.1 kg)  12/17/23 162 lb 8 oz (73.7 kg)  12/04/23 164 lb 8 oz (74.6 kg)   There is no height or weight on file to calculate BMI. Performance status (ECOG): 1 - Symptomatic but completely ambulatory Physical Exam Constitutional:      Appearance: Normal appearance. He is not ill-appearing.  HENT:     Mouth/Throat:     Mouth: Mucous membranes are moist.     Pharynx: Oropharynx is clear. No oropharyngeal exudate or posterior oropharyngeal erythema.   Cardiovascular:     Rate and Rhythm: Normal rate and regular rhythm.     Heart sounds: No murmur heard.    No friction rub. No gallop.  Pulmonary:     Effort: Pulmonary effort is normal. No respiratory distress.     Breath sounds: Normal breath sounds. No wheezing, rhonchi or rales.  Abdominal:     General: Bowel sounds are normal. There is no distension.     Palpations: Abdomen is soft. There is no mass.     Tenderness: There is no abdominal tenderness.  Musculoskeletal:        General: No swelling.     Right lower leg: No edema.     Left lower leg: No edema.  Lymphadenopathy:     Cervical: No cervical adenopathy.     Upper Body:     Right upper body: No supraclavicular or axillary adenopathy.     Left upper body: No supraclavicular or axillary adenopathy.     Lower Body: No right inguinal adenopathy. No left inguinal adenopathy.  Skin:    General: Skin is warm.     Coloration: Skin is not jaundiced.     Findings: No lesion or rash.  Neurological:     General: No focal deficit present.     Mental Status: He is alert and oriented to person, place, and time. Mental status is at baseline.  Psychiatric:        Mood and  Affect: Mood normal.        Behavior: Behavior normal.        Thought Content: Thought content normal.    PATHOLOGY:  His Guardant testing did not reveal any mutations/alterations.  His disease also came back microsatellite stable.   SCANS:  CT scans of his abdomen/pelvis done on 02/06/2024 revealed the following: FINDINGS: Lower chest: The visualized lung bases are clear. There is coronary vascular calcification.   No intra-abdominal free air or free fluid.   Hepatobiliary: A 2 cm cyst in the right lobe of the liver. No biliary dilatation. The gallbladder is unremarkable.   Pancreas: Unremarkable. No pancreatic ductal dilatation or surrounding inflammatory changes.   Spleen: Normal in size without focal abnormality.   Adrenals/Urinary Tract: The adrenal  glands unremarkable. There is no hydronephrosis on either side. Small bilateral renal cysts. There is symmetric enhancement and excretion of contrast by both kidneys. The visualized ureters and urinary bladder unremarkable.   Stomach/Bowel: There is sigmoid diverticulosis. There is no bowel obstruction or active inflammation. Postsurgical changes of bowel with ileocolic anastomosis in the right upper quadrant.  Appendectomy.   Vascular/Lymphatic: Mild aortoiliac atherosclerotic disease. The IVC is unremarkable. No portal venous gas. There is no adenopathy.   Reproductive: Enlarged prostate gland measuring 5.5 cm in transverse axial diameter. The seminal vesicles are symmetric.   Other: None   Musculoskeletal: Osteopenia with degenerative changes of the spine.  No acute osseous pathology.   IMPRESSION: 1. No acute intra-abdominal or pelvic pathology. No evidence of metastatic disease in the abdomen or pelvis. 2. Sigmoid diverticulosis. No bowel obstruction. 3. Enlarged prostate gland. 4.  Aortic Atherosclerosis (ICD10-I70.0).   LABS:    Latest Reference Range & Units 11/12/23 15:30 02/06/24 15:12 04/07/24 10:17  CEA ([facility]) 0.00 - 5.00 ng/mL 8.68 (H) 7.78 (H) 7.73 (H)  (H): Data is abnormally high  ASSESSMENT & PLAN:  A 89 y.o. male with metastatic colon cancer.   In clinic today, I went over all of his CT scan images with him, for which he could see that there remains no evidence of disease progression despite the recent increases in his CEA level.  Furthermore, the CEA level he had drawn yesterday trended lower, which is also encouraging.  Although radiology commented on him not having any evidence of metastatic disease, there were 2 areas on his PET scan from a few months ago that lit up, for which I can still see on his current CT scans.  However, they appear to have not changed in size.  I consider his disease under ideal control.  Based upon this, he will continue taking Xeloda  twice  daily, every 2 out of 3 weeks.  As he is clinically doing well, I will see him back in 2 months for repeat clinical assessment.  The patient understands all the plans discussed today and is in agreement with them.    Alyah Boehning DELENA Kerns, MD

## 2024-04-08 ENCOUNTER — Other Ambulatory Visit: Payer: Self-pay

## 2024-04-08 ENCOUNTER — Other Ambulatory Visit: Payer: Self-pay | Admitting: Oncology

## 2024-04-08 ENCOUNTER — Inpatient Hospital Stay (HOSPITAL_BASED_OUTPATIENT_CLINIC_OR_DEPARTMENT_OTHER): Admitting: Oncology

## 2024-04-08 ENCOUNTER — Telehealth: Payer: Self-pay | Admitting: Oncology

## 2024-04-08 VITALS — BP 168/63 | HR 47 | Temp 97.7°F | Resp 16 | Ht 65.0 in | Wt 167.0 lb

## 2024-04-08 DIAGNOSIS — C786 Secondary malignant neoplasm of retroperitoneum and peritoneum: Secondary | ICD-10-CM | POA: Diagnosis not present

## 2024-04-08 DIAGNOSIS — C787 Secondary malignant neoplasm of liver and intrahepatic bile duct: Secondary | ICD-10-CM

## 2024-04-08 DIAGNOSIS — C184 Malignant neoplasm of transverse colon: Secondary | ICD-10-CM

## 2024-04-08 DIAGNOSIS — C182 Malignant neoplasm of ascending colon: Secondary | ICD-10-CM | POA: Diagnosis not present

## 2024-04-08 DIAGNOSIS — C189 Malignant neoplasm of colon, unspecified: Secondary | ICD-10-CM | POA: Diagnosis not present

## 2024-04-08 DIAGNOSIS — Z79899 Other long term (current) drug therapy: Secondary | ICD-10-CM | POA: Diagnosis not present

## 2024-04-08 NOTE — Telephone Encounter (Signed)
 Patient has been scheduled for follow-up visit per 04/08/24 LOS.  Pt given an appt calendar with date and time.

## 2024-04-22 ENCOUNTER — Other Ambulatory Visit: Payer: Self-pay

## 2024-04-24 ENCOUNTER — Other Ambulatory Visit: Payer: Self-pay

## 2024-04-24 NOTE — Progress Notes (Signed)
 Specialty Pharmacy Refill Coordination Note  Jorge Lawrence is a 89 y.o. male contacted today regarding refills of specialty medication(s) Capecitabine  (XELODA )   Patient requested Delivery   Delivery date: 05/01/24   Verified address: [address]  [place], [place]  [address]   Medication will be filled on 09.18.25.

## 2024-05-14 ENCOUNTER — Other Ambulatory Visit: Payer: Self-pay

## 2024-05-14 ENCOUNTER — Encounter (INDEPENDENT_AMBULATORY_CARE_PROVIDER_SITE_OTHER): Payer: Self-pay

## 2024-05-14 ENCOUNTER — Other Ambulatory Visit: Payer: Self-pay | Admitting: Pharmacy Technician

## 2024-05-14 NOTE — Progress Notes (Signed)
 Specialty Pharmacy Refill Coordination Note  Jorge Lawrence is a 89 y.o. male contacted today regarding refills of specialty medication(s) Capecitabine  (XELODA )   Patient requested (Patient-Rptd) Delivery   Delivery date: 05/19/24 Verified address: (Patient-Rptd) [address]  , [place]. [address]   Medication will be filled on 05/18/24.

## 2024-05-18 ENCOUNTER — Other Ambulatory Visit: Payer: Self-pay

## 2024-05-22 DIAGNOSIS — R0789 Other chest pain: Secondary | ICD-10-CM | POA: Diagnosis not present

## 2024-05-22 DIAGNOSIS — J159 Unspecified bacterial pneumonia: Secondary | ICD-10-CM | POA: Diagnosis not present

## 2024-05-22 DIAGNOSIS — R0609 Other forms of dyspnea: Secondary | ICD-10-CM | POA: Diagnosis not present

## 2024-05-22 DIAGNOSIS — R059 Cough, unspecified: Secondary | ICD-10-CM | POA: Diagnosis not present

## 2024-06-02 DIAGNOSIS — D3132 Benign neoplasm of left choroid: Secondary | ICD-10-CM | POA: Diagnosis not present

## 2024-06-02 DIAGNOSIS — H31092 Other chorioretinal scars, left eye: Secondary | ICD-10-CM | POA: Diagnosis not present

## 2024-06-02 DIAGNOSIS — H353134 Nonexudative age-related macular degeneration, bilateral, advanced atrophic with subfoveal involvement: Secondary | ICD-10-CM | POA: Diagnosis not present

## 2024-06-02 DIAGNOSIS — H43813 Vitreous degeneration, bilateral: Secondary | ICD-10-CM | POA: Diagnosis not present

## 2024-06-03 ENCOUNTER — Other Ambulatory Visit: Payer: Self-pay

## 2024-06-03 ENCOUNTER — Other Ambulatory Visit (HOSPITAL_COMMUNITY): Payer: Self-pay

## 2024-06-04 ENCOUNTER — Inpatient Hospital Stay

## 2024-06-04 ENCOUNTER — Inpatient Hospital Stay: Attending: Oncology

## 2024-06-04 DIAGNOSIS — C182 Malignant neoplasm of ascending colon: Secondary | ICD-10-CM | POA: Insufficient documentation

## 2024-06-04 DIAGNOSIS — C787 Secondary malignant neoplasm of liver and intrahepatic bile duct: Secondary | ICD-10-CM | POA: Diagnosis not present

## 2024-06-04 DIAGNOSIS — Z23 Encounter for immunization: Secondary | ICD-10-CM | POA: Diagnosis not present

## 2024-06-04 DIAGNOSIS — Z1331 Encounter for screening for depression: Secondary | ICD-10-CM | POA: Diagnosis not present

## 2024-06-04 DIAGNOSIS — C184 Malignant neoplasm of transverse colon: Secondary | ICD-10-CM

## 2024-06-04 DIAGNOSIS — C786 Secondary malignant neoplasm of retroperitoneum and peritoneum: Secondary | ICD-10-CM | POA: Diagnosis not present

## 2024-06-04 DIAGNOSIS — E785 Hyperlipidemia, unspecified: Secondary | ICD-10-CM | POA: Diagnosis not present

## 2024-06-04 DIAGNOSIS — J189 Pneumonia, unspecified organism: Secondary | ICD-10-CM | POA: Diagnosis not present

## 2024-06-04 DIAGNOSIS — Z79631 Long term (current) use of antimetabolite agent: Secondary | ICD-10-CM | POA: Diagnosis not present

## 2024-06-04 DIAGNOSIS — Z131 Encounter for screening for diabetes mellitus: Secondary | ICD-10-CM | POA: Diagnosis not present

## 2024-06-04 DIAGNOSIS — Z0000 Encounter for general adult medical examination without abnormal findings: Secondary | ICD-10-CM | POA: Diagnosis not present

## 2024-06-04 DIAGNOSIS — I10 Essential (primary) hypertension: Secondary | ICD-10-CM | POA: Diagnosis not present

## 2024-06-04 LAB — CBC WITH DIFFERENTIAL (CANCER CENTER ONLY)
Abs Immature Granulocytes: 0.01 K/uL (ref 0.00–0.07)
Basophils Absolute: 0.1 K/uL (ref 0.0–0.1)
Basophils Relative: 1 %
Eosinophils Absolute: 0.1 K/uL (ref 0.0–0.5)
Eosinophils Relative: 1 %
HCT: 44.4 % (ref 39.0–52.0)
Hemoglobin: 14.5 g/dL (ref 13.0–17.0)
Immature Granulocytes: 0 %
Lymphocytes Relative: 16 %
Lymphs Abs: 1 K/uL (ref 0.7–4.0)
MCH: 33.1 pg (ref 26.0–34.0)
MCHC: 32.7 g/dL (ref 30.0–36.0)
MCV: 101.4 fL — ABNORMAL HIGH (ref 80.0–100.0)
Monocytes Absolute: 0.5 K/uL (ref 0.1–1.0)
Monocytes Relative: 7 %
Neutro Abs: 5 K/uL (ref 1.7–7.7)
Neutrophils Relative %: 75 %
Platelet Count: 243 K/uL (ref 150–400)
RBC: 4.38 MIL/uL (ref 4.22–5.81)
RDW: 14.3 % (ref 11.5–15.5)
WBC Count: 6.6 K/uL (ref 4.0–10.5)
nRBC: 0 % (ref 0.0–0.2)

## 2024-06-04 LAB — CMP (CANCER CENTER ONLY)
ALT: 16 U/L (ref 0–44)
AST: 18 U/L (ref 15–41)
Albumin: 3.7 g/dL (ref 3.5–5.0)
Alkaline Phosphatase: 104 U/L (ref 38–126)
Anion gap: 9 (ref 5–15)
BUN: 16 mg/dL (ref 8–23)
CO2: 27 mmol/L (ref 22–32)
Calcium: 10.1 mg/dL (ref 8.9–10.3)
Chloride: 103 mmol/L (ref 98–111)
Creatinine: 1.04 mg/dL (ref 0.61–1.24)
GFR, Estimated: >60 mL/min (ref >60)
Glucose, Bld: 111 mg/dL — ABNORMAL HIGH (ref 70–99)
Potassium: 4 mmol/L (ref 3.5–5.1)
Sodium: 139 mmol/L (ref 135–145)
Total Bilirubin: 0.7 mg/dL (ref 0.0–1.2)
Total Protein: 7.5 g/dL (ref 6.5–8.1)

## 2024-06-04 LAB — CEA (ACCESS): CEA (CHCC): 8.15 ng/mL — ABNORMAL HIGH (ref 0.00–5.00)

## 2024-06-08 ENCOUNTER — Ambulatory Visit: Admitting: Oncology

## 2024-06-08 ENCOUNTER — Inpatient Hospital Stay: Admitting: Oncology

## 2024-06-09 ENCOUNTER — Other Ambulatory Visit: Payer: Self-pay

## 2024-06-09 ENCOUNTER — Ambulatory Visit: Admitting: Oncology

## 2024-06-10 NOTE — Progress Notes (Unsigned)
 [facility]  [address] [place],  [place]  [address] [phone]  Clinic Day:  02/07/2024  Referring physician: Clemmie Nest, MD   HISTORY OF PRESENT ILLNESS:  The patient is a 89 y.o. male with metastatic colon cancer, which includes spread of disease to his liver and peritoneum.  He has been taking single-agent Xeloda  in the palliative setting for disease control.  Despite a brief rise in his CEA level, recent CT scans fortunately showed no evidence of disease progression.  He comes in today for routine follow-up.  Since his last visit, the patient has been doing very well.  He continues to take his Xeloda  prescription as prescribed.  Other than occasional fatigue, the patient denies having any other problems.  He also denies having any GI symptoms which concern him for overt signs of disease progression.  With respect to his colon cancer history, he was diagnosed with stage IIIB (T3 N1b M0) colon cancer, status post a right hemicolectomy in January 2023.  This was followed by 4 cycles of Xeloda , which he completed in May 2023.  Of note, CT scans done in June 2024 when his CEA level was elevated at 5.8 showed no evidence of disease recurrence.  However, as his CEA level rose even higher to 7.24, a PET scan was done, which revealed his metastatic disease.  Ultimately, the patient decided to be placed back on single-agent Xeloda  for his palliative disease management.  PHYSICAL EXAM:  There were no vitals taken for this visit. Wt Readings from Last 3 Encounters:  04/08/24 167 lb (75.8 kg)  02/07/24 165 lb 9.6 oz (75.1 kg)  12/17/23 162 lb 8 oz (73.7 kg)   There is no height or weight on file to calculate BMI. Performance status (ECOG): 1 - Symptomatic but completely ambulatory Physical Exam Constitutional:      Appearance: Normal appearance. He is not ill-appearing.  HENT:     Mouth/Throat:     Mouth: Mucous membranes are moist.     Pharynx:  Oropharynx is clear. No oropharyngeal exudate or posterior oropharyngeal erythema.  Cardiovascular:     Rate and Rhythm: Normal rate and regular rhythm.     Heart sounds: No murmur heard.    No friction rub. No gallop.  Pulmonary:     Effort: Pulmonary effort is normal. No respiratory distress.     Breath sounds: Normal breath sounds. No wheezing, rhonchi or rales.  Abdominal:     General: Bowel sounds are normal. There is no distension.     Palpations: Abdomen is soft. There is no mass.     Tenderness: There is no abdominal tenderness.  Musculoskeletal:        General: No swelling.     Right lower leg: No edema.     Left lower leg: No edema.  Lymphadenopathy:     Cervical: No cervical adenopathy.     Upper Body:     Right upper body: No supraclavicular or axillary adenopathy.     Left upper body: No supraclavicular or axillary adenopathy.     Lower Body: No right inguinal adenopathy. No left inguinal adenopathy.  Skin:    General: Skin is warm.     Coloration: Skin is not jaundiced.     Findings: No lesion or rash.  Neurological:     General: No focal deficit present.     Mental Status: He is alert and oriented to person, place, and time. Mental status is at baseline.  Psychiatric:        Mood and Affect: Mood normal.        Behavior: Behavior normal.        Thought Content: Thought content normal.    PATHOLOGY:  His Guardant testing did not reveal any mutations/alterations.  His disease also came back microsatellite stable.    LABS:    Latest Reference Range & Units 11/12/23 15:30 02/06/24 15:12 04/07/24 10:17 06/04/24 10:12  CEA ([facility]) 0.00 - 5.00 ng/mL 8.68 (H) 7.78 (H) 7.73 (H) 8.15 (H)  (H): Data is abnormally high   ASSESSMENT & PLAN:  A 89 y.o. male with metastatic colon cancer.  I am pleased as his CEA level has held stable over these past few months.  Clinically, the patient appears to be doing fairly well. Based upon this, he will continue taking Xeloda  twice  daily, every 2 out of 3 weeks.  As he is clinically doing well, I will see him back in another 2 months for repeat clinical assessment.  The patient understands all the plans discussed today and is in agreement with them.    Khady Vandenberg DELENA Kerns, MD

## 2024-06-11 ENCOUNTER — Other Ambulatory Visit: Payer: Self-pay

## 2024-06-11 ENCOUNTER — Inpatient Hospital Stay (HOSPITAL_BASED_OUTPATIENT_CLINIC_OR_DEPARTMENT_OTHER): Admitting: Oncology

## 2024-06-11 ENCOUNTER — Other Ambulatory Visit: Payer: Self-pay | Admitting: Oncology

## 2024-06-11 ENCOUNTER — Telehealth: Payer: Self-pay | Admitting: Oncology

## 2024-06-11 VITALS — BP 134/73 | HR 80 | Temp 97.9°F | Resp 16 | Ht 65.0 in | Wt 159.8 lb

## 2024-06-11 DIAGNOSIS — C787 Secondary malignant neoplasm of liver and intrahepatic bile duct: Secondary | ICD-10-CM

## 2024-06-11 DIAGNOSIS — C182 Malignant neoplasm of ascending colon: Secondary | ICD-10-CM | POA: Diagnosis not present

## 2024-06-11 DIAGNOSIS — C189 Malignant neoplasm of colon, unspecified: Secondary | ICD-10-CM

## 2024-06-11 DIAGNOSIS — Z79631 Long term (current) use of antimetabolite agent: Secondary | ICD-10-CM | POA: Diagnosis not present

## 2024-06-11 DIAGNOSIS — C786 Secondary malignant neoplasm of retroperitoneum and peritoneum: Secondary | ICD-10-CM | POA: Diagnosis not present

## 2024-06-11 NOTE — Telephone Encounter (Signed)
 Patient has been scheduled for follow-up visit per 06/11/24 LOS.  Pt given an appt calendar with date and time.

## 2024-06-15 ENCOUNTER — Other Ambulatory Visit: Payer: Self-pay

## 2024-07-06 ENCOUNTER — Other Ambulatory Visit: Payer: Self-pay

## 2024-07-06 NOTE — Progress Notes (Signed)
 Specialty Pharmacy Ongoing Clinical Assessment Note  Jorge Lawrence is a 89 y.o. male who is being followed by the specialty pharmacy service for RxSp Oncology   Patient's specialty medication(s) reviewed today: Capecitabine  (XELODA )   Missed doses in the last 4 weeks: More than 5 (medication was on hold for 3 weeks due to pneumonia)   Patient/Caregiver did not have any additional questions or concerns.   Therapeutic benefit summary: Patient is achieving benefit   Adverse events/side effects summary: Experienced adverse events/side effects (mild fatigue, tolerable at this time)   Patient's therapy is appropriate to: Continue    Goals Addressed             This Visit's Progress    Maintain optimal adherence to therapy   On track    Patient is on track. Patient will maintain adherence.  Patient had to hold for 3 weeks due to pneumonia, but typically doesn't miss doses.           Follow up: 3 months  Silvano LOISE Dolly Specialty Pharmacist

## 2024-07-06 NOTE — Progress Notes (Signed)
 Specialty Pharmacy Refill Coordination Note  Jorge Lawrence is a 89 y.o. male contacted today regarding refills of specialty medication(s) Capecitabine  (XELODA )   Patient requested Delivery   Delivery date: 07/07/24   Verified address: [address]   [place] [address]   Medication will be filled on: 07/06/24

## 2024-07-20 ENCOUNTER — Other Ambulatory Visit: Payer: Self-pay

## 2024-07-21 ENCOUNTER — Other Ambulatory Visit: Payer: Self-pay

## 2024-07-22 ENCOUNTER — Other Ambulatory Visit: Payer: Self-pay

## 2024-07-23 DIAGNOSIS — H04123 Dry eye syndrome of bilateral lacrimal glands: Secondary | ICD-10-CM | POA: Diagnosis not present

## 2024-07-23 DIAGNOSIS — H5203 Hypermetropia, bilateral: Secondary | ICD-10-CM | POA: Diagnosis not present

## 2024-07-23 DIAGNOSIS — H353132 Nonexudative age-related macular degeneration, bilateral, intermediate dry stage: Secondary | ICD-10-CM | POA: Diagnosis not present

## 2024-07-24 ENCOUNTER — Other Ambulatory Visit: Payer: Self-pay

## 2024-07-24 NOTE — Progress Notes (Signed)
 Specialty Pharmacy Refill Coordination Note  Jorge Lawrence is a 89 y.o. male contacted today regarding refills of specialty medication(s) Capecitabine  (XELODA )   Patient requested Delivery   Delivery date: 07/27/24   Verified address: [address]   [place] [address]   Medication will be filled on: 07/31/24   Spoke with patient's daughter

## 2024-07-28 DIAGNOSIS — H353134 Nonexudative age-related macular degeneration, bilateral, advanced atrophic with subfoveal involvement: Secondary | ICD-10-CM | POA: Diagnosis not present

## 2024-07-30 ENCOUNTER — Inpatient Hospital Stay (HOSPITAL_BASED_OUTPATIENT_CLINIC_OR_DEPARTMENT_OTHER): Admission: RE | Admit: 2024-07-30 | Source: Ambulatory Visit | Admitting: Radiology

## 2024-08-03 ENCOUNTER — Telehealth: Payer: Self-pay | Admitting: Oncology

## 2024-08-03 NOTE — Telephone Encounter (Signed)
 08/03/24 Patients daughter(Rhonda) cancelled appts.Patient is sick and they will call back to reschedule scans and DV.

## 2024-08-05 ENCOUNTER — Other Ambulatory Visit: Payer: Self-pay | Admitting: Hematology and Oncology

## 2024-08-05 ENCOUNTER — Other Ambulatory Visit: Payer: Self-pay

## 2024-08-05 DIAGNOSIS — C184 Malignant neoplasm of transverse colon: Secondary | ICD-10-CM

## 2024-08-07 ENCOUNTER — Other Ambulatory Visit: Payer: Self-pay

## 2024-08-07 ENCOUNTER — Other Ambulatory Visit: Payer: Self-pay | Admitting: Hematology and Oncology

## 2024-08-07 ENCOUNTER — Other Ambulatory Visit (HOSPITAL_COMMUNITY): Payer: Self-pay

## 2024-08-07 DIAGNOSIS — C184 Malignant neoplasm of transverse colon: Secondary | ICD-10-CM

## 2024-08-07 NOTE — Progress Notes (Signed)
 Specialty Pharmacy Refill Coordination Note  Jorge Lawrence is a 89 y.o. male contacted today regarding refills of specialty medication(s) Capecitabine  (XELODA )   Patient requested Delivery   Delivery date: 08/12/24   Verified address: [address] [place]   Medication will be filled on: 08/11/24

## 2024-08-10 ENCOUNTER — Inpatient Hospital Stay: Attending: Oncology

## 2024-08-10 ENCOUNTER — Other Ambulatory Visit: Payer: Self-pay

## 2024-08-10 ENCOUNTER — Other Ambulatory Visit (HOSPITAL_COMMUNITY): Payer: Self-pay

## 2024-08-10 MED ORDER — CAPECITABINE 500 MG PO TABS
ORAL_TABLET | ORAL | 5 refills | Status: AC
  Filled 2024-08-10: qty 70, 21d supply, fill #0
  Filled 2024-08-25 – 2024-08-26 (×2): qty 70, 21d supply, fill #1

## 2024-08-11 ENCOUNTER — Other Ambulatory Visit: Payer: Self-pay

## 2024-08-11 ENCOUNTER — Inpatient Hospital Stay: Admitting: Oncology

## 2024-08-25 ENCOUNTER — Other Ambulatory Visit: Payer: Self-pay

## 2024-08-26 ENCOUNTER — Other Ambulatory Visit: Payer: Self-pay

## 2024-08-28 ENCOUNTER — Other Ambulatory Visit: Payer: Self-pay

## 2024-09-01 ENCOUNTER — Other Ambulatory Visit: Payer: Self-pay
